# Patient Record
Sex: Female | Born: 1985 | Race: Black or African American | Hispanic: No | Marital: Married | Smoking: Never smoker
Health system: Southern US, Community
[De-identification: ages and names within clinical notes are randomized; demographics above are authoritative.]

## PROBLEM LIST (undated history)

## (undated) DIAGNOSIS — R569 Unspecified convulsions: Secondary | ICD-10-CM

## (undated) HISTORY — PX: HERNIA REPAIR: SHX51

---
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
## 2007-05-30 ENCOUNTER — Ambulatory Visit: Payer: Self-pay | Admitting: Family Medicine

## 2015-01-09 HISTORY — PX: BRAIN SURGERY: SHX531

## 2015-03-26 ENCOUNTER — Encounter: Payer: Self-pay | Admitting: Emergency Medicine

## 2015-03-26 ENCOUNTER — Emergency Department: Payer: Self-pay

## 2015-03-26 ENCOUNTER — Inpatient Hospital Stay
Admission: EM | Admit: 2015-03-26 | Discharge: 2015-03-29 | DRG: 070 | Disposition: A | Discharge status: Home / Self Care | Payer: Self-pay | Attending: Internal Medicine | Admitting: Internal Medicine

## 2015-03-26 DIAGNOSIS — G939 Disorder of brain, unspecified: Principal | ICD-10-CM | POA: Diagnosis present

## 2015-03-26 DIAGNOSIS — D496 Neoplasm of unspecified behavior of brain: Secondary | ICD-10-CM

## 2015-03-26 DIAGNOSIS — R569 Unspecified convulsions: Secondary | ICD-10-CM

## 2015-03-26 DIAGNOSIS — F22 Delusional disorders: Secondary | ICD-10-CM

## 2015-03-26 DIAGNOSIS — R000 Tachycardia, unspecified: Secondary | ICD-10-CM | POA: Diagnosis present

## 2015-03-26 DIAGNOSIS — Z833 Family history of diabetes mellitus: Secondary | ICD-10-CM

## 2015-03-26 DIAGNOSIS — Z8249 Family history of ischemic heart disease and other diseases of the circulatory system: Secondary | ICD-10-CM

## 2015-03-26 DIAGNOSIS — G9389 Other specified disorders of brain: Secondary | ICD-10-CM | POA: Diagnosis present

## 2015-03-26 DIAGNOSIS — G936 Cerebral edema: Secondary | ICD-10-CM | POA: Diagnosis present

## 2015-03-26 DIAGNOSIS — G4089 Other seizures: Secondary | ICD-10-CM | POA: Diagnosis present

## 2015-03-26 DIAGNOSIS — E876 Hypokalemia: Secondary | ICD-10-CM | POA: Diagnosis present

## 2015-03-26 HISTORY — DX: Unspecified convulsions: R56.9

## 2015-03-26 LAB — CBC WITH DIFFERENTIAL/PLATELET
BASOS ABS: 0 10*3/uL (ref 0–0.1)
BASOS PCT: 0 %
EOS ABS: 0 10*3/uL (ref 0–0.7)
Eosinophils Relative: 0 %
HEMATOCRIT: 38.9 % (ref 35.0–47.0)
Hemoglobin: 13.5 g/dL (ref 12.0–16.0)
Lymphocytes Relative: 21 %
Lymphs Abs: 1.9 10*3/uL (ref 1.0–3.6)
MCH: 31.7 pg (ref 26.0–34.0)
MCHC: 34.6 g/dL (ref 32.0–36.0)
MCV: 91.6 fL (ref 80.0–100.0)
MONO ABS: 0.4 10*3/uL (ref 0.2–0.9)
Monocytes Relative: 5 %
NEUTROS ABS: 6.7 10*3/uL — AB (ref 1.4–6.5)
NEUTROS PCT: 74 %
Platelets: 308 10*3/uL (ref 150–440)
RBC: 4.25 MIL/uL (ref 3.80–5.20)
RDW: 12.3 % (ref 11.5–14.5)
WBC: 9.1 10*3/uL (ref 3.6–11.0)

## 2015-03-26 LAB — BASIC METABOLIC PANEL
ANION GAP: 10 (ref 5–15)
BUN: 10 mg/dL (ref 6–20)
CALCIUM: 9.2 mg/dL (ref 8.9–10.3)
CO2: 22 mmol/L (ref 22–32)
CREATININE: 0.8 mg/dL (ref 0.44–1.00)
Chloride: 106 mmol/L (ref 101–111)
Glucose, Bld: 106 mg/dL — ABNORMAL HIGH (ref 65–99)
Potassium: 3.3 mmol/L — ABNORMAL LOW (ref 3.5–5.1)
SODIUM: 138 mmol/L (ref 135–145)

## 2015-03-26 LAB — TROPONIN I

## 2015-03-26 MED ORDER — SODIUM CHLORIDE 0.9 % IV BOLUS (SEPSIS)
1000.0000 mL | Freq: Once | INTRAVENOUS | Status: AC
  Administered 2015-03-26: 1000 mL via INTRAVENOUS

## 2015-03-26 MED ORDER — LEVETIRACETAM 500 MG/5ML IV SOLN
1000.0000 mg | Freq: Once | INTRAVENOUS | Status: AC
  Administered 2015-03-27: 1000 mg via INTRAVENOUS
  Filled 2015-03-26: qty 10

## 2015-03-26 MED ORDER — DEXAMETHASONE SODIUM PHOSPHATE 10 MG/ML IJ SOLN
10.0000 mg | Freq: Once | INTRAMUSCULAR | Status: AC
  Administered 2015-03-26: 10 mg via INTRAVENOUS
  Filled 2015-03-26: qty 1

## 2015-03-26 MED ORDER — ACETAMINOPHEN 325 MG PO TABS
650.0000 mg | ORAL_TABLET | Freq: Once | ORAL | Status: AC
  Administered 2015-03-26: 650 mg via ORAL
  Filled 2015-03-26: qty 2

## 2015-03-26 NOTE — ED Provider Notes (Signed)
[facility] Emergency Department Provider Note  ____________________________________________  Time seen: Approximately 9 PM  I have reviewed the triage vital signs and the nursing notes.   HISTORY  Chief Complaint Seizures    HPI Natasha Luna is a 30 y.o. female without any chronic medical conditions who is presenting to the emergency department tonight after seizure. The seizure was witnessed by a friend who said that the patient turned around and then started falling to the ground. The friend was able to catch the patient before she hit her head hard against the ground. The friend said that the patient then had generalized seizure-like activity for about a minute. Did have some confusion afterwards but returned to her baseline mental status fairly quickly. Did not lose any bowel or bladder continence. Patient has no complaints at this time. Says that about a month ago she was seen at [facility] for another seizure episode. This was her first ever seizure episode. She has no history of head trauma, no family history of seizures. She is not a drinker. She is denying any weakness or pain at this time.   Past Medical History  Diagnosis Date  . Seizures ([place])     There are no active problems to display for this patient.   Past Surgical History  Procedure Laterality Date  . Hernia repair      No current outpatient prescriptions on file.  Allergies Review of patient's allergies indicates no known allergies.  No family history on file.  Social History Social History  Substance Use Topics  . Smoking status: Never Smoker   . Smokeless tobacco: None  . Alcohol Use: None    Review of Systems Constitutional: No fever/chills Eyes: No visual changes. ENT: No sore throat. Cardiovascular: Denies chest pain. Respiratory: Denies shortness of breath. Gastrointestinal: No abdominal pain.  No nausea, no vomiting.  No diarrhea.  No  constipation. Genitourinary: Negative for dysuria. Musculoskeletal: Negative for back pain. Skin: Negative for rash. Neurological: Negative for headaches, focal weakness or numbness.  10-point ROS otherwise negative.  ____________________________________________   PHYSICAL EXAM:  VITAL SIGNS: ED Triage Vitals  Enc Vitals Group     BP 03/26/15 2012 118/77 mmHg     Pulse Rate 03/26/15 2012 96     Resp 03/26/15 2100 25     Temp 03/26/15 2012 98 F (36.7 C)     Temp src --      SpO2 03/26/15 2012 100 %     Weight 03/26/15 2012 190 lb (86.183 kg)     Height 03/26/15 2012 5\' 10"  (1.778 m)     Head Cir --      Peak Flow --      Pain Score 03/26/15 2142 1     Pain Loc --      Pain Edu? --      Excl. in [place]? --     Constitutional: Alert and oriented. Well appearing and in no acute distress. Eyes: Conjunctivae are normal. PERRL. EOMI. Head: Atraumatic. Nose: No congestion/rhinnorhea. Mouth/Throat: Mucous membranes are moist.  Neck: No stridor.   Cardiovascular: Normal rate, regular rhythm. Grossly normal heart sounds.  Good peripheral circulation. Respiratory: Normal respiratory effort.  No retractions. Lungs CTAB. Gastrointestinal: Soft and nontender. No distention. No CVA tenderness. Musculoskeletal: No lower extremity tenderness nor edema.  No joint effusions. Neurologic:  Normal speech and language. No gross focal neurologic deficits are appreciated.  Skin:  Skin is warm, dry and intact. No rash noted. Psychiatric:  Mood and affect are normal. Speech and behavior are normal.  ____________________________________________   LABS (all labs ordered are listed, but only abnormal results are displayed)  Labs Reviewed  CBC WITH DIFFERENTIAL/PLATELET - Abnormal; Notable for the following:    Neutro Abs 6.7 (*)    All other components within normal limits  BASIC METABOLIC PANEL - Abnormal; Notable for the following:    Potassium 3.3 (*)    Glucose, Bld 106 (*)    All other  components within normal limits  TROPONIN I   ____________________________________________  EKG  ED ECG REPORT I, Doran Stabler, the attending physician, personally viewed and interpreted this ECG.   Date: 03/26/2015  EKG Time: 2019  Rate: 106  Rhythm: sinus tachycardia  Axis: Left axis deviation  Intervals:none  ST&T Change: No ST elevation or depression. No abnormal T-wave inversion.  ____________________________________________  RADIOLOGY   IMPRESSION: 1. Left frontal lobe vasogenic edema with associated mass. 2. Findings are suspicious for left frontal lobe mass. Further evaluation with MRI is recommended. 3. Critical Value/emergent results were called by telephone at the time of interpretation on 03/26/2015 at 9:47 p.m. to Dr. Larae Grooms , who verbally acknowledged these results.   Electronically Signed By: Nolon Nations M.D. On: 03/26/2015 21:50 ____________________________________________   PROCEDURES   ____________________________________________   INITIAL IMPRESSION / ASSESSMENT AND PLAN / ED COURSE  Pertinent labs & imaging results that were available during my care of the patient were reviewed by me and considered in my medical decision making (see chart for details).  ----------------------------------------- 11:58 PM on 03/26/2015 -----------------------------------------  I discussed the case with Dr. Grayland Ormond of oncology who recommends 10 mg of Decadron initially and admission to this hospital. I did discuss possible transfer because of the lack of neurosurgery at this hospital however he feels that with Decadron that the swelling will be able to be decreased and that was only 6 mm of shift that there is not a need for neurosurgery in-house. I also discussed the case with Dr. Doy Mince of neurology who recommends a Keppra load of 1 g and then 500 mg every 12 hours.  I discussed the findings with the patient as well as the family  who are at the bedside. She understands that this is likely a mass or cancer in her brain causing the symptoms and the seizures. She understands need for admission to the hospital. The patient was signed out to Dr. Melynda Ripple of the medicine service. ____________________________________________   FINAL CLINICAL IMPRESSION(S) / ED DIAGNOSES  Seizure. Brain mass.    Orbie Pyo, MD 03/26/15 [id number]

## 2015-03-26 NOTE — ED Notes (Signed)
Patient brought in by ems from [facility]. Patient has a witnessed seizure that lasted about 1 minute. Patient with history of one previous seizure about a month ago and has been seen at [place].

## 2015-03-26 NOTE — ED Notes (Signed)
MD at bedside. 

## 2015-03-27 ENCOUNTER — Encounter: Payer: Self-pay | Admitting: Internal Medicine

## 2015-03-27 ENCOUNTER — Inpatient Hospital Stay: Payer: Self-pay

## 2015-03-27 DIAGNOSIS — G9389 Other specified disorders of brain: Secondary | ICD-10-CM | POA: Diagnosis present

## 2015-03-27 DIAGNOSIS — E876 Hypokalemia: Secondary | ICD-10-CM | POA: Diagnosis present

## 2015-03-27 DIAGNOSIS — G936 Cerebral edema: Secondary | ICD-10-CM | POA: Diagnosis present

## 2015-03-27 DIAGNOSIS — R569 Unspecified convulsions: Secondary | ICD-10-CM

## 2015-03-27 DIAGNOSIS — G939 Disorder of brain, unspecified: Principal | ICD-10-CM

## 2015-03-27 DIAGNOSIS — F419 Anxiety disorder, unspecified: Secondary | ICD-10-CM

## 2015-03-27 DIAGNOSIS — Z79899 Other long term (current) drug therapy: Secondary | ICD-10-CM

## 2015-03-27 LAB — CBC
HEMATOCRIT: 37.9 % (ref 35.0–47.0)
HEMOGLOBIN: 13.2 g/dL (ref 12.0–16.0)
MCH: 31.8 pg (ref 26.0–34.0)
MCHC: 34.8 g/dL (ref 32.0–36.0)
MCV: 91.4 fL (ref 80.0–100.0)
Platelets: 291 10*3/uL (ref 150–440)
RBC: 4.15 MIL/uL (ref 3.80–5.20)
RDW: 12.4 % (ref 11.5–14.5)
WBC: 10.1 10*3/uL (ref 3.6–11.0)

## 2015-03-27 LAB — BASIC METABOLIC PANEL
ANION GAP: 4 — AB (ref 5–15)
BUN: 10 mg/dL (ref 6–20)
CALCIUM: 8.9 mg/dL (ref 8.9–10.3)
CHLORIDE: 110 mmol/L (ref 101–111)
CO2: 22 mmol/L (ref 22–32)
Creatinine, Ser: 0.69 mg/dL (ref 0.44–1.00)
GFR calc Af Amer: >60 mL/min (ref >60)
GFR calc non Af Amer: >60 mL/min (ref >60)
GLUCOSE: 154 mg/dL — AB (ref 65–99)
POTASSIUM: 3.9 mmol/L (ref 3.5–5.1)
Sodium: 136 mmol/L (ref 135–145)

## 2015-03-27 LAB — MAGNESIUM: Magnesium: 1.8 mg/dL (ref 1.7–2.4)

## 2015-03-27 MED ORDER — ENOXAPARIN SODIUM 40 MG/0.4ML ~~LOC~~ SOLN
40.0000 mg | SUBCUTANEOUS | Status: DC
  Administered 2015-03-27 – 2015-03-29 (×3): 40 mg via SUBCUTANEOUS
  Filled 2015-03-27 (×3): qty 0.4

## 2015-03-27 MED ORDER — LEVETIRACETAM 500 MG PO TABS
500.0000 mg | ORAL_TABLET | Freq: Two times a day (BID) | ORAL | Status: DC
  Administered 2015-03-27 – 2015-03-29 (×6): 500 mg via ORAL
  Filled 2015-03-27 (×6): qty 1

## 2015-03-27 MED ORDER — MORPHINE SULFATE (PF) 2 MG/ML IV SOLN
INTRAVENOUS | Status: AC
  Administered 2015-03-26: 02:00:00 2 mg via INTRAVENOUS
  Filled 2015-03-27: qty 1

## 2015-03-27 MED ORDER — DIPHENHYDRAMINE HCL 25 MG PO CAPS
50.0000 mg | ORAL_CAPSULE | Freq: Three times a day (TID) | ORAL | Status: DC | PRN
  Administered 2015-03-27 – 2015-03-29 (×2): 50 mg via ORAL
  Filled 2015-03-27 (×3): qty 2

## 2015-03-27 MED ORDER — IOHEXOL 300 MG/ML  SOLN
100.0000 mL | Freq: Once | INTRAMUSCULAR | Status: AC | PRN
  Administered 2015-03-27: 100 mL via INTRAVENOUS

## 2015-03-27 MED ORDER — LORAZEPAM 2 MG/ML IJ SOLN
2.0000 mg | INTRAMUSCULAR | Status: DC | PRN
  Administered 2015-03-27: 2 mg via INTRAVENOUS
  Filled 2015-03-27: qty 1

## 2015-03-27 MED ORDER — IOHEXOL 240 MG/ML SOLN
25.0000 mL | INTRAMUSCULAR | Status: AC
  Administered 2015-03-27 (×2): 25 mL via ORAL

## 2015-03-27 MED ORDER — MORPHINE SULFATE (PF) 2 MG/ML IV SOLN
2.0000 mg | INTRAVENOUS | Status: DC | PRN
  Administered 2015-03-26 – 2015-03-27 (×2): 2 mg via INTRAVENOUS

## 2015-03-27 MED ORDER — POTASSIUM CHLORIDE 20 MEQ/15ML (10%) PO SOLN
40.0000 meq | Freq: Once | ORAL | Status: AC
  Administered 2015-03-27: 40 meq via ORAL
  Filled 2015-03-27: qty 30

## 2015-03-27 MED ORDER — DEXAMETHASONE SODIUM PHOSPHATE 10 MG/ML IJ SOLN
10.0000 mg | Freq: Four times a day (QID) | INTRAMUSCULAR | Status: DC
Start: 2015-03-27 — End: 2015-03-29
  Administered 2015-03-27 – 2015-03-29 (×10): 10 mg via INTRAVENOUS
  Filled 2015-03-27 (×10): qty 1

## 2015-03-27 NOTE — H&P (Signed)
[facility] at [place] NAME: Natasha Luna    MR#:  [id number]  DATE OF BIRTH:  08-25-1985  DATE OF ADMISSION:  03/26/2015  PRIMARY CARE PHYSICIAN: [place]   REQUESTING/REFERRING PHYSICIAN: Dr Dineen Kid  CHIEF COMPLAINT:   Chief Complaint  Patient presents with  . Seizures    HISTORY OF PRESENT ILLNESS: Natasha Luna  is a 30 y.o. female with no significant medical history except for a seizure that occurred about one month ago. She presented to the ED today after she had another episode of seizures. Patient was said to [place] when she suddenly spun to the left and started falling. While she was falling she was reported to have arm and leg contraction and her fall incomplete as her friend stop her from completely falling to the ground. She continues to have spasms for about 1 minute and she had some jerking movements all over her body with twisting of late and have this was said to have become ashen at that time. There is no frothy saliva or biting of tongue observed and the school episode of left-sided about 1 minute. After the seizure episode, patient was confused for a few minutes but there was no reported paralysis or incontinence. She was reported to have some fast heart beat and fast breathing after this episode. Patient denies any alcohol use or recreational drug use. She had one seizure about a month ago while bathing her baby but she was not started on treatment because he was a first episode.  On arrival in the ED today, vital signs are stable except for an initial respiratory rate of 25 but this has resolved at this time. CBC was unremarkable and CMP was significant for potassium of 3.3 only. Troponin was negative. CT of the head showed left frontal lobe mass with vasogenic edema as well as some 6 mm left-to-right shift. There is no hemorrhage in the brain. EKG shows sinus  tachycardia at 106 with left atrial enlargement and left axis deviation and decreased voltage. 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  She wishes to be full code.  PAST MEDICAL HISTORY:   Past Medical History  Diagnosis Date  . Seizures ([place])     PAST SURGICAL HISTORY: Past Surgical History  Procedure Laterality Date  . Hernia repair      SOCIAL HISTORY:  Social History  Substance Use Topics  . Smoking status: Never Smoker   . Smokeless tobacco: Not on file  . Alcohol Use: Not on file    FAMILY HISTORY:  Family History  Problem Relation Age of Onset  . Diabetes Mother     DRUG ALLERGIES: No Known Allergies  REVIEW OF SYSTEMS:   CONSTITUTIONAL: No fever. EYES: No blurred or double vision.  EARS, NOSE, AND THROAT: No tinnitus or ear pain.  RESPIRATORY: No cough, shortness of breath, wheezing or hemoptysis.  CARDIOVASCULAR: No chest pain, orthopnea, edema.  GASTROINTESTINAL: No nausea, vomiting, diarrhea or abdominal pain.  GENITOURINARY: No dysuria, hematuria.  ENDOCRINE: No polyuria, nocturia,  HEMATOLOGY: No anemia, easy bruising or bleeding SKIN: No rash or lesion. MUSCULOSKELETAL: No joint pain or arthritis.   NEUROLOGIC: No tingling, numbness, weakness.  PSYCHIATRY: No anxiety or depression.  MEDICATIONS AT HOME:  Prior to Admission medications   Not on File      PHYSICAL EXAMINATION:   VITAL SIGNS: Blood pressure 113/65, pulse 92, temperature 98 F (36.7 C), resp. rate 17, height 5\' 10"  (1.778 m), weight 86.183 kg (190 lb), last menstrual period 03/12/2015, SpO2 100 %.  GENERAL:  31 y.o.-year-old patient lying in the bed with no acute distress. Alert and oriented x 3. EYES: Pupils equal, round, reactive to light and accommodation. No scleral icterus. Extraocular muscles intact.   HEENT: Head atraumatic, normocephalic. Oropharynx and nasopharynx clear.  NECK:  Supple, no jugular venous distention. No thyroid enlargement, no tenderness.  LUNGS: Normal breath sounds bilaterally, no wheezing, rales,rhonchi or crepitation. No use of accessory muscles of respiration.  CARDIOVASCULAR: S1, S2 normal. No murmurs, rubs, or gallops.  ABDOMEN: Soft, nontender, nondistended. Bowel sounds present. No organomegaly or mass.  EXTREMITIES: No pedal edema, cyanosis, or clubbing.  NEUROLOGIC: Cranial nerves II through XII are intact. Muscle strength 5/5 in all extremities. Sensation intact. Gait not checked.   SKIN: No obvious rash, lesion, or ulcer.   LABORATORY PANEL:   CBC  Recent Labs Lab 03/26/15 2020  WBC 9.1  HGB 13.5  HCT 38.9  PLT 308  MCV 91.6  MCH 31.7  MCHC 34.6  RDW 12.3  LYMPHSABS 1.9  MONOABS 0.4  EOSABS 0.0  BASOSABS 0.0   ------------------------------------------------------------------------------------------------------------------  Chemistries   Recent Labs Lab 03/26/15 2020  NA 138  K 3.3*  CL 106  CO2 22  GLUCOSE 106*  BUN 10  CREATININE 0.80  CALCIUM 9.2   ------------------------------------------------------------------------------------------------------------------ estimated creatinine clearance is 123.8 mL/min (by C-G formula based on Cr of 0.8). ------------------------------------------------------------------------------------------------------------------ No results for input(s): TSH, T4TOTAL, T3FREE, THYROIDAB in the last 72 hours.  Invalid input(s): FREET3   Coagulation profile No results for input(s): INR, PROTIME in the last 168 hours. ------------------------------------------------------------------------------------------------------------------- No results for input(s): DDIMER in the last 72  hours. -------------------------------------------------------------------------------------------------------------------  Cardiac Enzymes  Recent Labs Lab 03/26/15 2020  TROPONINI <0.03   ------------------------------------------------------------------------------------------------------------------ Invalid input(s): POCBNP  ---------------------------------------------------------------------------------------------------------------  Urinalysis No results found for: COLORURINE, APPEARANCEUR, LABSPEC, PHURINE, GLUCOSEU, HGBUR, BILIRUBINUR, KETONESUR, PROTEINUR, UROBILINOGEN, NITRITE, LEUKOCYTESUR   RADIOLOGY: Ct Head Wo Contrast  03/26/2015  CLINICAL DATA:  Witnessed seizure today at [employer], one previous seizure last month, pt seen at [facility], but states no imaging was done EXAM: CT HEAD WITHOUT CONTRAST TECHNIQUE: Contiguous axial images were obtained from the base of the skull through the vertex without intravenous contrast. COMPARISON:  None. FINDINGS: There is significant vasogenic edema in the left frontal lobe, suspicious for focal mass. There is focal left-to-right midline shift measuring 6 mm. No associated hemorrhage. There is mild mass effect on the left frontal warm. Otherwise, the basilar cisterns and ventricles are normal in appearance. Bone windows are unremarkable. IMPRESSION: 1. Left frontal lobe vasogenic edema with associated mass. 2. Findings are suspicious for left frontal lobe mass. Further evaluation with MRI is recommended. 3. Critical Value/emergent results were called by telephone at the time of interpretation on 03/26/2015 at 9:47 p.m. to Dr. Larae Grooms , who verbally acknowledged these results. Electronically Signed   By: Nolon Nations M.D.   On: 03/26/2015 21:50    EKG: Orders placed or performed during the hospital encounter of 03/26/15  . EKG 12-Lead  . 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 Left frontal lobe mass causing cerebral edema with mass effect adjacent area - Patient presents to the ED with a seizure but was found to have left frontal lobe mass causing a mass effect on CT scan. - MRI will be obtained to better characterize the lesion. - Oncology was consulted in the ED and recommended IV Decadron due to cerebral edema - Continue neuro checks and place official consult to oncology. - Check TSH  2). 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  - Patient had one seizure about a month ago and was not started on any treatment.  - Neurology was consulted in the ED and recommend loading patient report, Keppra and continued 500 mg twice a day. - Continue seizure precautions. - When necessary IV lorazepam for acute seizures  3). Hypokalemia - Patient presented with a potassium of 3.3. We'll check magnesium and replete potassium - Monitor potassium levels.  All the records are reviewed and case discussed with ED provider. Management plans discussed with the patient, family and they are in agreement.  CODE STATUS: Code Status History    This patient does not have a recorded code status. Please follow your organizational policy for patients in this situation.      I have independently reviewed all EKG and chest x-ray data  VTE prophylaxis: Lovenox if no contraindications and patient at low risk for bleeding. SCD's and progressive ambulation if patient has contraindications to anticoagulants. No DVT prophylaxis if patient presently receiving therapeutic anticoagulation or is at significant risk of bleeding for which the risk of anticoagulation outweigh the potential benefits.  Vaccinations: Pneumonia & flu vaccine per hospital  protocol Prevention: Will proceed with conservative measures for the prevention of delirium in patients older than 65. Fall precautions and 1:1 sitter as needed per hospital protocol.  TOTAL TIME TAKING CARE OF THIS PATIENT: 50 minutes.    Sylvan Cheese M.D on 03/27/2015 at 12:28 AM  Between 7am to 6pm - Pager - [phone]  After 6pm go to [email or web address] - password [email or web address] [facility]  [facility]  Office  [phone]  CC: Primary care physician; [place]

## 2015-03-27 NOTE — Consult Note (Signed)
[place]  Telephone:(336) [phone] Fax:(336) [phone]  ID: Natasha Luna OB: [birth year]  MR#: [id number]  [id number]  Patient Care Team: [place] as PCP - General (General Practice)  CHIEF COMPLAINT:  Chief Complaint  Patient presents with  . Seizures    INTERVAL HISTORY: Patient is a 30 year old female who had a witnessed tonic-clonic seizure yesterday. Patient was brought to the emergency room and CT scan of brain revealed left frontal lobe mass with significant vasogenic edema. Patient had a similar episode approximately one month ago and was evaluated at [facility], but no brain imaging was done at that time. Patient is anxious, but otherwise feels well. She has had no further seizures. She has no other neurologic complaints. She denies any recent fevers or illnesses. She has a good appetite and denies weight loss. She has no chest pain, shortness of breath, cough, or hemoptysis. She has no nausea, vomiting, constipation, or diarrhea. She has no urinary complaints. Patient otherwise feels well and offers no further specific complaints.  REVIEW OF SYSTEMS:   Review of Systems  Constitutional: Negative for fever, weight loss and malaise/fatigue.  HENT: Negative for hearing loss.   Eyes: Negative for blurred vision, double vision and photophobia.  Respiratory: Negative.  Negative for cough, hemoptysis, sputum production and shortness of breath.   Cardiovascular: Negative.  Negative for chest pain.  Gastrointestinal: Negative.  Negative for blood in stool and melena.  Genitourinary: Negative.   Musculoskeletal: Negative.   Neurological: Positive for seizures and loss of consciousness. Negative for sensory change, focal weakness, weakness and headaches.  Psychiatric/Behavioral: The patient is nervous/anxious.     As per HPI. Otherwise, a complete review of systems is negatve.  PAST MEDICAL HISTORY: Past Medical History  Diagnosis Date    . Seizures ([place])     PAST SURGICAL HISTORY: Past Surgical History  Procedure Laterality Date  . Hernia repair      FAMILY HISTORY Family History  Problem Relation Age of Onset  . Diabetes Mother        ADVANCED DIRECTIVES:    HEALTH MAINTENANCE: Social History  Substance Use Topics  . Smoking status: Never Smoker   . Smokeless tobacco: None  . Alcohol Use: None     Colonoscopy:  PAP:  Bone density:  Lipid panel:  No Known Allergies  Current Facility-Administered Medications  Medication Dose Route Frequency Provider Last Rate Last Dose  . dexamethasone (DECADRON) injection 10 mg  10 mg Intravenous 4 times per day Sylvan Cheese, MD   10 mg at 03/27/15 0624  . enoxaparin (LOVENOX) injection 40 mg  40 mg Subcutaneous Q24H Sylvan Cheese, MD   40 mg at 03/27/15 0618  . levETIRAcetam (KEPPRA) tablet 500 mg  500 mg Oral BID Sylvan Cheese, MD   500 mg at 03/27/15 0617  . LORazepam (ATIVAN) injection 2 mg  2 mg Intravenous Q15 min PRN Sylvan Cheese, MD      . morphine 2 MG/ML injection 2 mg  2 mg Intravenous Q4H PRN Sylvan Cheese, MD   2 mg at 03/27/15 0122    OBJECTIVE: Filed Vitals:   03/27/15 0230 03/27/15 0313  BP: 105/64 116/63  Pulse:  79  Temp:  97.9 F (36.6 C)  Resp: 21 16     Body mass index is 27.26 kg/(m^2).    ECOG FS:0 - Asymptomatic  General: Well-developed, well-nourished, no acute distress. Eyes: Pink conjunctiva, anicteric sclera. HEENT: Normocephalic, moist mucous membranes, clear oropharnyx. Lungs: Clear to  auscultation bilaterally. Heart: Regular rate and rhythm. No rubs, murmurs, or gallops. Abdomen: Soft, nontender, nondistended. No organomegaly noted, normoactive bowel sounds. Musculoskeletal: No edema, cyanosis, or clubbing. Neuro: Alert, answering all questions appropriately. Cranial nerves grossly intact. Skin: No rashes or petechiae noted. Psych: Normal affect. Lymphatics: No cervical, calvicular, axillary or inguinal  LAD.   LAB RESULTS:  Lab Results  Component Value Date   NA 136 03/27/2015   K 3.9 03/27/2015   CL 110 03/27/2015   CO2 22 03/27/2015   GLUCOSE 154* 03/27/2015   BUN 10 03/27/2015   CREATININE 0.69 03/27/2015   CALCIUM 8.9 03/27/2015   GFRNONAA >60 03/27/2015   GFRAA >60 03/27/2015    Lab Results  Component Value Date   WBC 10.1 03/27/2015   NEUTROABS 6.7* 03/26/2015   HGB 13.2 03/27/2015   HCT 37.9 03/27/2015   MCV 91.4 03/27/2015   PLT 291 03/27/2015     STUDIES: Ct Head Wo Contrast  03/26/2015  CLINICAL DATA:  Witnessed seizure today at [employer], one previous seizure last month, pt seen at [facility], but states no imaging was done EXAM: CT HEAD WITHOUT CONTRAST TECHNIQUE: Contiguous axial images were obtained from the base of the skull through the vertex without intravenous contrast. COMPARISON:  None. FINDINGS: There is significant vasogenic edema in the left frontal lobe, suspicious for focal mass. There is focal left-to-right midline shift measuring 6 mm. No associated hemorrhage. There is mild mass effect on the left frontal warm. Otherwise, the basilar cisterns and ventricles are normal in appearance. Bone windows are unremarkable. IMPRESSION: 1. Left frontal lobe vasogenic edema with associated mass. 2. Findings are suspicious for left frontal lobe mass. Further evaluation with MRI is recommended. 3. Critical Value/emergent results were called by telephone at the time of interpretation on 03/26/2015 at 9:47 p.m. to Dr. Larae Grooms , who verbally acknowledged these results. Electronically Signed   By: Nolon Nations M.D.   On: 03/26/2015 21:50    ASSESSMENT: Left frontal lobe mass suspicious for malignancy.  PLAN:    1. Left frontal lobe mass: Highly suspicious for malignancy, unclear if it primary brain tumor or metastatic lesion. Continue 10 mg Decadron IV every 6 hours to help reduce vasogenic edema. Will order CT scan of the chest, abdomen, and pelvis  for further evaluation and assess if there is a primary lesion. Radiation oncology has also been consulted for further evaluation. 2. Seizures: Secondary to left frontal lobe mass. Appreciate neurology input. Decadron as above to reduce vasogenic edema. Continue Keppra as ordered. 3. Disposition: Patient can likely be discharged in several days on oral Decadron and Keppra.  Appreciate consult, will follow.   Lloyd Huger, MD   03/27/2015 7:24 AM

## 2015-03-27 NOTE — Consult Note (Signed)
Reason for Consult:Seizure Referring Physician: Gouru  CC: Seizure  HPI: Natasha Luna is an 30 y.o. female  with no significant medical history except for a seizure that occurred about one month ago. She presented to the ED yesterday after she had another episode of seizure. Patient reports that she was [place] around [place] with her [place] when she suddenly felt her head pull to the right and she started falling. Per her friend while she was falling she was reported to have arm and leg contraction.  She did not incur injury with her fall because her friend stopped her from completely falling to the ground. She continued to have spasms for about 1 minute and she had some generalized jerking movements.  After the seizure episode, patient was confused for a few minutes.  There was no associated tongue biting, bowel/bladder incontinence.    Past Medical History  Diagnosis Date  . Seizures [facility])     Past Surgical History  Procedure Laterality Date  . Hernia repair      Family History  Problem Relation Age of Onset  . Diabetes Mother     Mother with hypertension  Social History:  reports that she has never smoked. She does not have any smokeless tobacco history on file. Her alcohol and drug histories are not on file.  No Known Allergies  Medications:  I have reviewed the patient's current medications. Prior to Admission:  No prescriptions prior to admission   Scheduled: . dexamethasone  10 mg Intravenous 4 times per day  . enoxaparin (LOVENOX) injection  40 mg Subcutaneous Q24H  . levETIRAcetam  500 mg Oral BID    ROS: History obtained from the patient  General ROS: negative for - chills, fatigue, fever, night sweats, weight gain or weight loss Psychological ROS: negative for - behavioral disorder, hallucinations, memory difficulties, mood swings or suicidal ideation Ophthalmic ROS: negative for - blurry vision, double vision, eye pain or loss of vision ENT ROS:  negative for - epistaxis, nasal discharge, oral lesions, sore throat, tinnitus or vertigo Allergy and Immunology ROS: negative for - hives or itchy/watery eyes Hematological and Lymphatic ROS: negative for - bleeding problems, bruising or swollen lymph nodes Endocrine ROS: negative for - galactorrhea, hair pattern changes, polydipsia/polyuria or temperature intolerance Respiratory ROS: negative for - cough, hemoptysis, shortness of breath or wheezing Cardiovascular ROS: negative for - chest pain, dyspnea on exertion, edema or irregular heartbeat Gastrointestinal ROS: negative for - abdominal pain, diarrhea, hematemesis, nausea/vomiting or stool incontinence Genito-Urinary ROS: negative for - dysuria, hematuria, incontinence or urinary frequency/urgency Musculoskeletal ROS: left arm pain Neurological ROS: as noted in HPI Dermatological ROS: negative for rash and skin lesion changes  Physical Examination: Blood pressure 116/63, pulse 79, temperature 97.9 F (36.6 C), temperature source Oral, resp. rate 16, height 5\' 10"  (1.778 m), weight 86.183 kg (190 lb), last menstrual period 03/12/2015, SpO2 96 %.  HEENT-  Normocephalic, no lesions, without obvious abnormality.  Normal external eye and conjunctiva.  Normal TM's bilaterally.  Normal auditory canals and external ears. Normal external nose, mucus membranes and septum.  Normal pharynx. Cardiovascular- S1, S2 normal, pulses palpable throughout   Lungs- chest clear, no wheezing, rales, normal symmetric air entry Abdomen- soft, non-tender; bowel sounds normal; no masses,  no organomegaly Extremities- no edema Lymph-no adenopathy palpable Musculoskeletal-no joint tenderness, deformity or swelling Skin-warm and dry, no hyperpigmentation, vitiligo, or suspicious lesions  Neurological Examination Mental Status: Alert, oriented, thought content appropriate.  Speech fluent without  evidence of aphasia.  Able to follow 3 step commands without  difficulty. Cranial Nerves: II: Discs flat bilaterally; Visual fields grossly normal, pupils equal, round, reactive to light and accommodation III,IV, VI: ptosis not present, extra-ocular motions intact bilaterally V,VII: smile symmetric, facial light touch sensation normal bilaterally VIII: hearing normal bilaterally IX,X: gag reflex present XI: bilateral shoulder shrug XII: midline tongue extension Motor: Right : Upper extremity   5/5    Left:     Upper extremity   5/5  Lower extremity   5/5     Lower extremity   5/5 Tone and bulk:normal tone throughout; no atrophy noted Sensory: Pinprick and light touch intact throughout, bilaterally Deep Tendon Reflexes: 2+ in the upper extremities and absent in the lower extremities.   Plantars: Right: downgoing   Left: downgoing Cerebellar: normal finger-to-nose, normal rapid alternating movements and normal heel-to-shin test Gait: not tested due to safety concerns      Laboratory Studies:   Basic Metabolic Panel:  Recent Labs Lab 03/26/15 2020 03/27/15 0441  NA 138 136  K 3.3* 3.9  CL 106 110  CO2 22 22  GLUCOSE 106* 154*  BUN 10 10  CREATININE 0.80 0.69  CALCIUM 9.2 8.9  MG 1.8  --     Liver Function Tests: No results for input(s): AST, ALT, ALKPHOS, BILITOT, PROT, ALBUMIN in the last 168 hours. No results for input(s): LIPASE, AMYLASE in the last 168 hours. No results for input(s): AMMONIA in the last 168 hours.  CBC:  Recent Labs Lab 03/26/15 2020 03/27/15 0441  WBC 9.1 10.1  NEUTROABS 6.7*  --   HGB 13.5 13.2  HCT 38.9 37.9  MCV 91.6 91.4  PLT 308 291    Cardiac Enzymes:  Recent Labs Lab 03/26/15 2020  TROPONINI <0.03    BNP: Invalid input(s): POCBNP  CBG: No results for input(s): GLUCAP in the last 168 hours.  Microbiology: No results found for this or any previous visit.  Coagulation Studies: No results for input(s): LABPROT, INR in the last 72 hours.  Urinalysis: No results for input(s):  COLORURINE, LABSPEC, PHURINE, GLUCOSEU, HGBUR, BILIRUBINUR, KETONESUR, PROTEINUR, UROBILINOGEN, NITRITE, LEUKOCYTESUR in the last 168 hours.  Invalid input(s): APPERANCEUR  Lipid Panel:  No results found for: CHOL, TRIG, HDL, CHOLHDL, VLDL, LDLCALC  HgbA1C: No results found for: HGBA1C  Urine Drug Screen:  No results found for: LABOPIA, COCAINSCRNUR, LABBENZ, AMPHETMU, THCU, LABBARB  Alcohol Level: No results for input(s): ETH in the last 168 hours.  Other results: EKG: sinus tachycardia at 106 bpm.  Imaging: Ct Head Wo Contrast  03/26/2015  CLINICAL DATA:  Witnessed seizure today at [employer], one previous seizure last month, pt seen at [facility], but states no imaging was done EXAM: CT HEAD WITHOUT CONTRAST TECHNIQUE: Contiguous axial images were obtained from the base of the skull through the vertex without intravenous contrast. COMPARISON:  None. FINDINGS: There is significant vasogenic edema in the left frontal lobe, suspicious for focal mass. There is focal left-to-right midline shift measuring 6 mm. No associated hemorrhage. There is mild mass effect on the left frontal warm. Otherwise, the basilar cisterns and ventricles are normal in appearance. Bone windows are unremarkable. IMPRESSION: 1. Left frontal lobe vasogenic edema with associated mass. 2. Findings are suspicious for left frontal lobe mass. Further evaluation with MRI is recommended. 3. Critical Value/emergent results were called by telephone at the time of interpretation on 03/26/2015 at 9:47 p.m. to Dr. Larae Grooms , who verbally acknowledged these  results. Electronically Signed   By: Nolon Nations M.D.   On: 03/26/2015 21:50     Assessment/Plan: 30 year old female presenting with recurrent seizure.  Neurological examination is nonfocal.  Head CT personally reviewed and shows left frontal lobe mas with vasogenic edema and midline shift.  Etiology unclear-metastasis versus primary.  Patient loaded with Keppra in  the ED.  Tolerating load well.  Oncology following patient and managing edema and work up for primary.  Recommendations: 1.  Seizure precautions 2.  Continue maintenance Keppra at 500mg  BID 3.  MRI of the brain with and without contrast 4.  Patient unable to drive, operate heavy machinery, perform activities at heights and participate in water activities until release by outpatient physician.  Alexis Goodell, MD Neurology [phone] 03/27/2015, 10:27 AM

## 2015-03-27 NOTE — Progress Notes (Signed)
[facility] [place] NAME: Natasha Luna    MR#:  [id number]  DATE OF BIRTH:  08/26/1985  SUBJECTIVE:  CHIEF COMPLAINT:  Patient is resting comfortably. No other episodes of seizure were noticed. Slept well overnight  REVIEW OF SYSTEMS:  CONSTITUTIONAL: No fever, fatigue or weakness.  EYES: No blurred or double vision.  EARS, NOSE, AND THROAT: No tinnitus or ear pain.  RESPIRATORY: No cough, shortness of breath, wheezing or hemoptysis.  CARDIOVASCULAR: No chest pain, orthopnea, edema.  GASTROINTESTINAL: No nausea, vomiting, diarrhea or abdominal pain.  GENITOURINARY: No dysuria, hematuria.  ENDOCRINE: No polyuria, nocturia,  HEMATOLOGY: No anemia, easy bruising or bleeding SKIN: No rash or lesion. MUSCULOSKELETAL: No joint pain or arthritis.   NEUROLOGIC: No tingling, numbness, weakness.  PSYCHIATRY: No anxiety or depression.   DRUG ALLERGIES:  No Known Allergies  VITALS:  Blood pressure 115/54, pulse 66, temperature 98.6 F (37 C), temperature source Oral, resp. rate 18, height 5\' 10"  (1.778 m), weight 86.183 kg (190 lb), last menstrual period 03/12/2015, SpO2 96 %.  PHYSICAL EXAMINATION:  GENERAL:  30 y.o.-year-old patient lying in the bed with no acute distress.  EYES: Pupils equal, round, reactive to light and accommodation. No scleral icterus. Extraocular muscles intact.  HEENT: Head atraumatic, normocephalic. Oropharynx and nasopharynx clear.  NECK:  Supple, no jugular venous distention. No thyroid enlargement, no tenderness.  LUNGS: Normal breath sounds bilaterally, no wheezing, rales,rhonchi or crepitation. No use of accessory muscles of respiration.  CARDIOVASCULAR: S1, S2 normal. No murmurs, rubs, or gallops.  ABDOMEN: Soft, nontender, nondistended. Bowel sounds present. No organomegaly or mass.  EXTREMITIES: No pedal edema, cyanosis, or clubbing.  NEUROLOGIC: Cranial nerves II through XII are intact. Muscle strength  5/5 in all extremities. Sensation intact. Gait not checked.  PSYCHIATRIC: The patient is alert and oriented x 3.  SKIN: No obvious rash, lesion, or ulcer.    LABORATORY PANEL:   CBC  Recent Labs Lab 03/27/15 0441  WBC 10.1  HGB 13.2  HCT 37.9  PLT 291   ------------------------------------------------------------------------------------------------------------------  Chemistries   Recent Labs Lab 03/26/15 2020 03/27/15 0441  NA 138 136  K 3.3* 3.9  CL 106 110  CO2 22 22  GLUCOSE 106* 154*  BUN 10 10  CREATININE 0.80 0.69  CALCIUM 9.2 8.9  MG 1.8  --    ------------------------------------------------------------------------------------------------------------------  Cardiac Enzymes  Recent Labs Lab 03/26/15 2020  TROPONINI <0.03   ------------------------------------------------------------------------------------------------------------------  RADIOLOGY:  Ct Head Wo Contrast  03/26/2015  CLINICAL DATA:  Witnessed seizure today at [employer], one previous seizure last month, pt seen at [facility], but states no imaging was done EXAM: CT HEAD WITHOUT CONTRAST TECHNIQUE: Contiguous axial images were obtained from the base of the skull through the vertex without intravenous contrast. COMPARISON:  None. FINDINGS: There is significant vasogenic edema in the left frontal lobe, suspicious for focal mass. There is focal left-to-right midline shift measuring 6 mm. No associated hemorrhage. There is mild mass effect on the left frontal warm. Otherwise, the basilar cisterns and ventricles are normal in appearance. Bone windows are unremarkable. IMPRESSION: 1. Left frontal lobe vasogenic edema with associated mass. 2. Findings are suspicious for left frontal lobe mass. Further evaluation with MRI is recommended. 3. Critical Value/emergent results were called by telephone at the time of interpretation on 03/26/2015 at 9:47 p.m. to Dr. Larae Grooms , who verbally acknowledged  these results. Electronically Signed   By: Nolon Nations M.D.  On: 03/26/2015 21:50   Ct Chest W Contrast  03/27/2015  CLINICAL DATA:  Probable left frontal lobe mass with associated vasogenic edema on a noncontrast head CT obtained yesterday. Clinical concern that this could represent a metastasis. EXAM: CT CHEST, ABDOMEN, AND PELVIS WITH CONTRAST TECHNIQUE: Multidetector CT imaging of the chest, abdomen and pelvis was performed following the standard protocol during bolus administration of intravenous contrast. CONTRAST:  147mL OMNIPAQUE IOHEXOL 300 MG/ML  SOLN COMPARISON:  Head CT obtained yesterday. FINDINGS: CT CHEST FINDINGS Mediastinum/Lymph Nodes: No masses, pathologically enlarged lymph nodes, or other significant abnormality. Lungs/Pleura: No pulmonary mass, infiltrate, or effusion. Musculoskeletal: No chest wall mass or suspicious bone lesions identified. CT ABDOMEN PELVIS FINDINGS Hepatobiliary: No masses or other significant abnormality. Pancreas: No mass, inflammatory changes, or other significant abnormality. Spleen: Within normal limits in size and appearance. Adrenals/Urinary Tract: No masses identified. No evidence of hydronephrosis. Stomach/Bowel: No evidence of obstruction, inflammatory process, or abnormal fluid collections. Vascular/Lymphatic: No pathologically enlarged lymph nodes. No evidence of abdominal aortic aneurysm. Reproductive: No mass or other significant abnormality. Intrauterine device in the uterus. Other: Trace amount of free peritoneal fluid in the inferior pelvis, within normal limits of physiological fluid. 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 No evidence of bone metastases. IMPRESSION: No evidence of malignancy in the chest, abdomen or pelvis. Electronically Signed   By: Claudie Revering M.D.   On: 03/27/2015 12:51   Ct Abdomen Pelvis W Contrast  03/27/2015  CLINICAL DATA:  Probable  left frontal lobe mass with associated vasogenic edema on a noncontrast head CT obtained yesterday. Clinical concern that this could represent a metastasis. EXAM: CT CHEST, ABDOMEN, AND PELVIS WITH CONTRAST TECHNIQUE: Multidetector CT imaging of the chest, abdomen and pelvis was performed following the standard protocol during bolus administration of intravenous contrast. CONTRAST:  174mL OMNIPAQUE IOHEXOL 300 MG/ML  SOLN COMPARISON:  Head CT obtained yesterday. FINDINGS: CT CHEST FINDINGS Mediastinum/Lymph Nodes: No masses, pathologically enlarged lymph nodes, or other significant abnormality. Lungs/Pleura: No pulmonary mass, infiltrate, or effusion. Musculoskeletal: No chest wall mass or suspicious bone lesions identified. CT ABDOMEN PELVIS FINDINGS Hepatobiliary: No masses or other significant abnormality. Pancreas: No mass, inflammatory changes, or other significant abnormality. Spleen: Within normal limits in size and appearance. Adrenals/Urinary Tract: No masses identified. No evidence of hydronephrosis. Stomach/Bowel: No evidence of obstruction, inflammatory process, or abnormal fluid collections. Vascular/Lymphatic: No pathologically enlarged lymph nodes. No evidence of abdominal aortic aneurysm. Reproductive: No mass or other significant abnormality. Intrauterine device in the uterus. Other: Trace amount of free peritoneal fluid in the inferior pelvis, within normal limits of physiological fluid. 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 No evidence of bone metastases. IMPRESSION: No evidence of malignancy in the chest, abdomen or pelvis. Electronically Signed   By: Claudie Revering M.D.   On: 03/27/2015 12:51    EKG:   Orders placed or performed during the hospital encounter of 03/26/15  . EKG 12-Lead  . EKG 12-Lead    ASSESSMENT AND PLAN:   1). 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 - MRI Brain pending. -Continue IV Decadron for cerebral edema -CT chest abdomen and pelvis with no masses -Appreciate oncology and neurology recommendations  -Oncology has  discussed with radiation oncology - Continue neuro checks  - Check TSH  2). 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  - Patient had one seizure about a month ago and was not started on any treatment.  - Neurology is recommending to continue Keppra  500 mg twice a day. - Continue seizure precautions. -Instructed not to drive ,  not to operate heavy machinery and not to perform activities at [facility] and water activities - When necessary IV lorazepam for acute seizures  3). Hypokalemia - Patient presented with a potassium of 3.3. -Repleted. Repeat potassium 3.9 and magnesium 1.8  All the records are reviewed and case discussed with ED provider. Management plans discussed with the patient, family and they are in agreement.     All the records are reviewed and case discussed with Care Management/Social Workerr. Management plans discussed with the patient, family and they are in agreement.  CODE STATUS: FC   TOTAL TIME TAKING CARE OF THIS PATIENT: 35 minutes.   POSSIBLE D/C IN 1-2  DAYS, DEPENDING ON CLINICAL CONDITION.   Nicholes Mango M.D on 03/27/2015 at 1:49 PM  Between 7am to 6pm - Pager - [phone] After 6pm go to [email or web address] - password [email or web address]  [facility] [place] [facility]  Office  [phone]  CC: Primary care physician; [place]

## 2015-03-27 NOTE — ED Notes (Signed)
Called floor to let them know pt on the way up 

## 2015-03-28 ENCOUNTER — Institutional Professional Consult (permissible substitution): Payer: Self-pay | Admitting: Radiation Oncology

## 2015-03-28 ENCOUNTER — Inpatient Hospital Stay: Admit: 2015-03-28 | Payer: Self-pay | Admitting: Radiation Oncology

## 2015-03-28 ENCOUNTER — Inpatient Hospital Stay: Payer: MEDICAID

## 2015-03-28 LAB — BASIC METABOLIC PANEL
Anion gap: 5 (ref 5–15)
BUN: 10 mg/dL (ref 6–20)
CALCIUM: 9.2 mg/dL (ref 8.9–10.3)
CO2: 23 mmol/L (ref 22–32)
CREATININE: 0.71 mg/dL (ref 0.44–1.00)
Chloride: 109 mmol/L (ref 101–111)
GFR calc non Af Amer: >60 mL/min (ref >60)
GLUCOSE: 161 mg/dL — AB (ref 65–99)
POTASSIUM: 4.1 mmol/L (ref 3.5–5.1)
SODIUM: 137 mmol/L (ref 135–145)

## 2015-03-28 LAB — TSH: TSH: 0.137 u[IU]/mL — AB (ref 0.350–4.500)

## 2015-03-28 LAB — T4, FREE: Free T4: 0.71 ng/dL (ref 0.61–1.12)

## 2015-03-28 MED ORDER — GADOBENATE DIMEGLUMINE 529 MG/ML IV SOLN
20.0000 mL | Freq: Once | INTRAVENOUS | Status: AC | PRN
  Administered 2015-03-28: 18 mL via INTRAVENOUS

## 2015-03-28 NOTE — Progress Notes (Signed)
[facility] [place] NAME: Natasha Luna    MR#:  [id number]  DATE OF BIRTH:  Jun 03, 1985  SUBJECTIVE:  CHIEF COMPLAINT:  Patient is resting comfortably. No other episodes of seizure were noticed. Slept well overnight   Still have some headache.  REVIEW OF SYSTEMS:  CONSTITUTIONAL: No fever, fatigue or weakness.  EYES: No blurred or double vision.  EARS, NOSE, AND THROAT: No tinnitus or ear pain.  RESPIRATORY: No cough, shortness of breath, wheezing or hemoptysis.  CARDIOVASCULAR: No chest pain, orthopnea, edema.  GASTROINTESTINAL: No nausea, vomiting, diarrhea or abdominal pain.  GENITOURINARY: No dysuria, hematuria.  ENDOCRINE: No polyuria, nocturia,  HEMATOLOGY: No anemia, easy bruising or bleeding SKIN: No rash or lesion. MUSCULOSKELETAL: No joint pain or arthritis.   NEUROLOGIC: No tingling, numbness, weakness.  PSYCHIATRY: No anxiety or depression.   DRUG ALLERGIES:  No Known Allergies  VITALS:  Blood pressure 114/56, pulse 65, temperature 98.6 F (37 C), temperature source Oral, resp. rate 20, height 5\' 10"  (1.778 m), weight 86.183 kg (190 lb), last menstrual period 03/12/2015, SpO2 97 %.  PHYSICAL EXAMINATION:  GENERAL:  30 y.o.-year-old patient lying in the bed with no acute distress.  EYES: Pupils equal, round, reactive to light and accommodation. No scleral icterus. Extraocular muscles intact.  HEENT: Head atraumatic, normocephalic. Oropharynx and nasopharynx clear.  NECK:  Supple, no jugular venous distention. No thyroid enlargement, no tenderness.  LUNGS: Normal breath sounds bilaterally, no wheezing, rales,rhonchi or crepitation. No use of accessory muscles of respiration.  CARDIOVASCULAR: S1, S2 normal. No murmurs, rubs, or gallops.  ABDOMEN: Soft, nontender, nondistended. Bowel sounds present. No organomegaly or mass.  EXTREMITIES: No pedal edema, cyanosis, or clubbing.  NEUROLOGIC: Cranial nerves II through XII  are intact. Muscle strength 5/5 in all extremities. Sensation intact. Gait not checked.  PSYCHIATRIC: The patient is alert and oriented x 3.  SKIN: No obvious rash, lesion, or ulcer.    LABORATORY PANEL:   CBC  Recent Labs Lab 03/27/15 0441  WBC 10.1  HGB 13.2  HCT 37.9  PLT 291   ------------------------------------------------------------------------------------------------------------------  Chemistries   Recent Labs Lab 03/26/15 2020  03/28/15 0549  NA 138  < > 137  K 3.3*  < > 4.1  CL 106  < > 109  CO2 22  < > 23  GLUCOSE 106*  < > 161*  BUN 10  < > 10  CREATININE 0.80  < > 0.71  CALCIUM 9.2  < > 9.2  MG 1.8  --   --   < > = values in this interval not displayed. ------------------------------------------------------------------------------------------------------------------  Cardiac Enzymes  Recent Labs Lab 03/26/15 2020  TROPONINI <0.03   ------------------------------------------------------------------------------------------------------------------  RADIOLOGY:  Ct Chest W Contrast  03/27/2015  CLINICAL DATA:  Probable left frontal lobe mass with associated vasogenic edema on a noncontrast head CT obtained yesterday. Clinical concern that this could represent a metastasis. EXAM: CT CHEST, ABDOMEN, AND PELVIS WITH CONTRAST TECHNIQUE: Multidetector CT imaging of the chest, abdomen and pelvis was performed following the standard protocol during bolus administration of intravenous contrast. CONTRAST:  161mL OMNIPAQUE IOHEXOL 300 MG/ML  SOLN COMPARISON:  Head CT obtained yesterday. FINDINGS: CT CHEST FINDINGS Mediastinum/Lymph Nodes: No masses, pathologically enlarged lymph nodes, or other significant abnormality. Lungs/Pleura: No pulmonary mass, infiltrate, or effusion. Musculoskeletal: No chest wall mass or suspicious bone lesions identified. CT ABDOMEN PELVIS FINDINGS Hepatobiliary: No masses or other significant abnormality. Pancreas: No mass, inflammatory  changes, or  other significant abnormality. Spleen: Within normal limits in size and appearance. Adrenals/Urinary Tract: No masses identified. No evidence of hydronephrosis. Stomach/Bowel: No evidence of obstruction, inflammatory process, or abnormal fluid collections. Vascular/Lymphatic: No pathologically enlarged lymph nodes. No evidence of abdominal aortic aneurysm. Reproductive: No mass or other significant abnormality. Intrauterine device in the uterus. Other: Trace amount of free peritoneal fluid in the inferior pelvis, within normal limits of physiological fluid. 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 No evidence of bone metastases. IMPRESSION: No evidence of malignancy in the chest, abdomen or pelvis. Electronically Signed   By: Claudie Revering M.D.   On: 03/27/2015 12:51   Mr Jeri Cos [id number] Contrast  03/28/2015  CLINICAL DATA:  Acute evaluation for seizures. Abnormal head CT with left frontal mass. EXAM: MRI HEAD WITHOUT AND WITH CONTRAST TECHNIQUE: Multiplanar, multiecho pulse sequences of the brain and surrounding structures were obtained without and with intravenous contrast. CONTRAST:  42mL MULTIHANCE GADOBENATE DIMEGLUMINE 529 MG/ML IV SOLN COMPARISON:  Head CT 03/26/2015 FINDINGS: Arising from the left posterior frontal convexity, there is a meningioma measuring 4.7 cm right to left, 3.5 cm front to back and 2.8 cm in thickness. This causes mass effect upon the left cerebral hemisphere and there is extensive vasogenic edema in the left frontal lobe. There is left-to-right shift of 2 mm. No evidence of primary intra-axial brain pathology. No sign of stroke. No hydrocephalus. No extra-axial fluid collection. No pituitary mass. Sinuses, middle ears and mastoids are clear. 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 Mass effect upon the brain with vasogenic edema in the left frontal  lobe. Electronically Signed   By: Nelson Chimes M.D.   On: 03/28/2015 13:20   Ct Abdomen Pelvis W Contrast  03/27/2015  CLINICAL DATA:  Probable left frontal lobe mass with associated vasogenic edema on a noncontrast head CT obtained yesterday. Clinical concern that this could represent a metastasis. EXAM: CT CHEST, ABDOMEN, AND PELVIS WITH CONTRAST TECHNIQUE: Multidetector CT imaging of the chest, abdomen and pelvis was performed following the standard protocol during bolus administration of intravenous contrast. CONTRAST:  169mL OMNIPAQUE IOHEXOL 300 MG/ML  SOLN COMPARISON:  Head CT obtained yesterday. FINDINGS: CT CHEST FINDINGS Mediastinum/Lymph Nodes: No masses, pathologically enlarged lymph nodes, or other significant abnormality. Lungs/Pleura: No pulmonary mass, infiltrate, or effusion. Musculoskeletal: No chest wall mass or suspicious bone lesions identified. CT ABDOMEN PELVIS FINDINGS Hepatobiliary: No masses or other significant abnormality. Pancreas: No mass, inflammatory changes, or other significant abnormality. Spleen: Within normal limits in size and appearance. Adrenals/Urinary Tract: No masses identified. No evidence of hydronephrosis. Stomach/Bowel: No evidence of obstruction, inflammatory process, or abnormal fluid collections. Vascular/Lymphatic: No pathologically enlarged lymph nodes. No evidence of abdominal aortic aneurysm. Reproductive: No mass or other significant abnormality. Intrauterine device in the uterus. Other: Trace amount of free peritoneal fluid in the inferior pelvis, within normal limits of physiological fluid. 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 No evidence of bone metastases. IMPRESSION: No evidence of malignancy in the chest, abdomen or pelvis. Electronically Signed   By: Claudie Revering M.D.   On: 03/27/2015 12:51    EKG:   Orders placed or performed during the hospital encounter of  03/26/15  . EKG 12-Lead  . EKG 12-Lead    ASSESSMENT AND PLAN:   1). 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 -Continue IV Decadron for cerebral edema -CT chest abdomen and pelvis with no masses -Appreciate oncology and neurology recommendations  -Oncology has discussed with radiation oncology - Continue neuro checks  - low TSH- will check- free T3, Free T4   2). 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  - Patient had one seizure about a month ago and was not started on any treatment.  - Neurology is recommending to continue Keppra  500 mg twice a day. - Continue seizure precautions. -Instructed not to drive ,  not to operate heavy machinery and not to perform activities at [facility] and water activities - When necessary IV lorazepam for acute seizures  3). Hypokalemia - Patient presented with a potassium of 3.3. -Repleted. Repeat potassium 3.9 and magnesium 1.8   All the records are reviewed and case discussed with ED provider. Management plans discussed with the patient, family and they are in agreement.   All the records are reviewed and case discussed with Care Management/Social Workerr. Management plans discussed with the patient, family and they are in agreement.  CODE STATUS: FC   TOTAL TIME TAKING CARE OF THIS PATIENT: 35 minutes.   POSSIBLE D/C IN 1-2  DAYS, DEPENDING ON CLINICAL CONDITION.   Vaughan Basta M.D on 03/28/2015 at 9:29 PM  Between 7am to 6pm - Pager - [phone] After 6pm go to [email or web address] - password [email or web address] [facility]  [facility]  Office  [phone]  CC: Primary care physician; [place]

## 2015-03-28 NOTE — Progress Notes (Signed)
[place]  [phone] [phone]  ID: Nickola Major OB: October 06, 1985  MR#: [id number]  [id number]  Patient Care Team: [place] as PCP - General (General Practice)  CHIEF COMPLAINT:  Chief Complaint  Patient presents with  . Seizures    INTERVAL HISTORY: Patient continues to be highly anxious, but otherwise feels well. She has no further seizure activity. She denies any neurologic complaints. Patient offers no further specific complaints.  REVIEW OF SYSTEMS:   Review of Systems  Constitutional: Negative for fever, weight loss and malaise/fatigue.  Respiratory: Negative.   Cardiovascular: Negative.   Gastrointestinal: Negative.   Genitourinary: Negative.   Musculoskeletal: Negative.   Neurological: Positive for seizures. Negative for focal weakness and weakness.    As per HPI. Otherwise, a complete review of systems is negatve.  PAST MEDICAL HISTORY: Past Medical History  Diagnosis Date  . Seizures ([place])     PAST SURGICAL HISTORY: Past Surgical History  Procedure Laterality Date  . Hernia repair      FAMILY HISTORY Family History  Problem Relation Age of Onset  . Diabetes Mother        ADVANCED DIRECTIVES:    HEALTH MAINTENANCE: Social History  Substance Use Topics  . Smoking status: Never Smoker   . Smokeless tobacco: None  . Alcohol Use: None     Colonoscopy:  PAP:  Bone density:  Lipid panel:  No Known Allergies  Current Facility-Administered Medications  Medication Dose Route Frequency Provider Last Rate Last Dose  . dexamethasone (DECADRON) injection 10 mg  10 mg Intravenous 4 times per day Sylvan Cheese, MD   10 mg at 03/28/15 1810  . diphenhydrAMINE (BENADRYL) capsule 50 mg  50 mg Oral Q8H PRN Idelle Crouch, MD   50 mg at 03/27/15 2136  . enoxaparin (LOVENOX) injection 40 mg  40 mg Subcutaneous Q24H Sylvan Cheese, MD   40 mg at 03/28/15 0519  . levETIRAcetam  (KEPPRA) tablet 500 mg  500 mg Oral BID Sylvan Cheese, MD   500 mg at 03/28/15 2102  . LORazepam (ATIVAN) injection 2 mg  2 mg Intravenous Q15 min PRN Sylvan Cheese, MD   2 mg at 03/27/15 1506  . morphine 2 MG/ML injection 2 mg  2 mg Intravenous Q4H PRN Sylvan Cheese, MD   2 mg at 03/27/15 0122    OBJECTIVE: Filed Vitals:   03/28/15 1728 03/28/15 2021  BP: 113/61 114/56  Pulse: 78 65  Temp: 98.2 F (36.8 C) 98.6 F (37 C)  Resp: 18 20     Body mass index is 27.26 kg/(m^2).    ECOG FS:0 - Asymptomatic  General: Well-developed, well-nourished, no acute distress. Eyes: Pink conjunctiva, anicteric sclera. Lungs: Clear to auscultation bilaterally. Heart: Regular rate and rhythm. No rubs, murmurs, or gallops. Abdomen: Soft, nontender, nondistended. No organomegaly noted, normoactive bowel sounds. Musculoskeletal: No edema, cyanosis, or clubbing. Neuro: Alert, answering all questions appropriately. Cranial nerves grossly intact. Skin: No rashes or petechiae noted. Psych: Normal affect.  LAB RESULTS:  Lab Results  Component Value Date   NA 137 03/28/2015   K 4.1 03/28/2015   CL 109 03/28/2015   CO2 23 03/28/2015   GLUCOSE 161* 03/28/2015   BUN 10 03/28/2015   CREATININE 0.71 03/28/2015   CALCIUM 9.2 03/28/2015   GFRNONAA >60 03/28/2015   GFRAA >60 03/28/2015    Lab Results  Component Value Date   WBC 10.1 03/27/2015   NEUTROABS 6.7* 03/26/2015   HGB  13.2 03/27/2015   HCT 37.9 03/27/2015   MCV 91.4 03/27/2015   PLT 291 03/27/2015     STUDIES: Ct Head Wo Contrast  03/26/2015  CLINICAL DATA:  Witnessed seizure today at [employer], one previous seizure last month, pt seen at [facility], but states no imaging was done EXAM: CT HEAD WITHOUT CONTRAST TECHNIQUE: Contiguous axial images were obtained from the base of the skull through the vertex without intravenous contrast. COMPARISON:  None. FINDINGS: There is significant vasogenic edema in the left frontal lobe,  suspicious for focal mass. There is focal left-to-right midline shift measuring 6 mm. No associated hemorrhage. There is mild mass effect on the left frontal warm. Otherwise, the basilar cisterns and ventricles are normal in appearance. Bone windows are unremarkable. IMPRESSION: 1. Left frontal lobe vasogenic edema with associated mass. 2. Findings are suspicious for left frontal lobe mass. Further evaluation with MRI is recommended. 3. Critical Value/emergent results were called by telephone at the time of interpretation on 03/26/2015 at 9:47 p.m. to Dr. Larae Grooms , who verbally acknowledged these results. Electronically Signed   By: Nolon Nations M.D.   On: 03/26/2015 21:50   Ct Chest W Contrast  03/27/2015  CLINICAL DATA:  Probable left frontal lobe mass with associated vasogenic edema on a noncontrast head CT obtained yesterday. Clinical concern that this could represent a metastasis. EXAM: CT CHEST, ABDOMEN, AND PELVIS WITH CONTRAST TECHNIQUE: Multidetector CT imaging of the chest, abdomen and pelvis was performed following the standard protocol during bolus administration of intravenous contrast. CONTRAST:  17mL OMNIPAQUE IOHEXOL 300 MG/ML  SOLN COMPARISON:  Head CT obtained yesterday. FINDINGS: CT CHEST FINDINGS Mediastinum/Lymph Nodes: No masses, pathologically enlarged lymph nodes, or other significant abnormality. Lungs/Pleura: No pulmonary mass, infiltrate, or effusion. Musculoskeletal: No chest wall mass or suspicious bone lesions identified. CT ABDOMEN PELVIS FINDINGS Hepatobiliary: No masses or other significant abnormality. Pancreas: No mass, inflammatory changes, or other significant abnormality. Spleen: Within normal limits in size and appearance. Adrenals/Urinary Tract: No masses identified. No evidence of hydronephrosis. Stomach/Bowel: No evidence of obstruction, inflammatory process, or abnormal fluid collections. Vascular/Lymphatic: No pathologically enlarged lymph nodes. No evidence  of abdominal aortic aneurysm. Reproductive: No mass or other significant abnormality. Intrauterine device in the uterus. Other: Trace amount of free peritoneal fluid in the inferior pelvis, within normal limits of physiological fluid. 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 No evidence of bone metastases. IMPRESSION: No evidence of malignancy in the chest, abdomen or pelvis. Electronically Signed   By: Claudie Revering M.D.   On: 03/27/2015 12:51   Mr Jeri Cos [id number] Contrast  03/28/2015  CLINICAL DATA:  Acute evaluation for seizures. Abnormal head CT with left frontal mass. EXAM: MRI HEAD WITHOUT AND WITH CONTRAST TECHNIQUE: Multiplanar, multiecho pulse sequences of the brain and surrounding structures were obtained without and with intravenous contrast. CONTRAST:  42mL MULTIHANCE GADOBENATE DIMEGLUMINE 529 MG/ML IV SOLN COMPARISON:  Head CT 03/26/2015 FINDINGS: Arising from the left posterior frontal convexity, there is a meningioma measuring 4.7 cm right to left, 3.5 cm front to back and 2.8 cm in thickness. This causes mass effect upon the left cerebral hemisphere and there is extensive vasogenic edema in the left frontal lobe. There is left-to-right shift of 2 mm. No evidence of primary intra-axial brain pathology. No sign of stroke. No hydrocephalus. No extra-axial fluid collection. No pituitary mass. Sinuses, middle ears and mastoids are clear. IMPRESSION: Left posterior frontal convexity  meningioma measuring 4.7 x 3.5 x 2.8 cm. Mass effect upon the brain with vasogenic edema in the left frontal lobe. Electronically Signed   By: Nelson Chimes M.D.   On: 03/28/2015 13:20   Ct Abdomen Pelvis W Contrast  03/27/2015  CLINICAL DATA:  Probable left frontal lobe mass with associated vasogenic edema on a noncontrast head CT obtained yesterday. Clinical concern that this could represent a metastasis. EXAM: CT CHEST, ABDOMEN, AND PELVIS WITH  CONTRAST TECHNIQUE: Multidetector CT imaging of the chest, abdomen and pelvis was performed following the standard protocol during bolus administration of intravenous contrast. CONTRAST:  135mL OMNIPAQUE IOHEXOL 300 MG/ML  SOLN COMPARISON:  Head CT obtained yesterday. FINDINGS: CT CHEST FINDINGS Mediastinum/Lymph Nodes: No masses, pathologically enlarged lymph nodes, or other significant abnormality. Lungs/Pleura: No pulmonary mass, infiltrate, or effusion. Musculoskeletal: No chest wall mass or suspicious bone lesions identified. CT ABDOMEN PELVIS FINDINGS Hepatobiliary: No masses or other significant abnormality. Pancreas: No mass, inflammatory changes, or other significant abnormality. Spleen: Within normal limits in size and appearance. Adrenals/Urinary Tract: No masses identified. No evidence of hydronephrosis. Stomach/Bowel: No evidence of obstruction, inflammatory process, or abnormal fluid collections. Vascular/Lymphatic: No pathologically enlarged lymph nodes. No evidence of abdominal aortic aneurysm. Reproductive: No mass or other significant abnormality. Intrauterine device in the uterus. Other: Trace amount of free peritoneal fluid in the inferior pelvis, within normal limits of physiological fluid. 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 No evidence of bone metastases. IMPRESSION: No evidence of malignancy in the chest, abdomen or pelvis. Electronically Signed   By: Claudie Revering M.D.   On: 03/27/2015 12:51    ASSESSMENT: 4.7 cm meningioma in left frontal lobe.  PLAN:    1. Left meningioma: MRI brain results reviewed independently and reported as above. CT scan of the chest, abdomen, pelvis did not reveal any significant pathology. Patient will require neurosurgical consultation likely as an outpatient. Case discussed with radiation oncology. No acute indication for XRT at this time. Continue Decadron and Keppra as  ordered. Okay to discharge from an oncology standpoint. We will attempt to discuss case with neurosurgery in the morning.  2. Seizures: Secondary to left frontal lobe mass. Appreciate neurology input. Decadron as above to reduce vasogenic edema. Continue Keppra as ordered. 3. Disposition: Patient can likely be discharged in several days on oral Decadron and Keppra.   Lloyd Huger, MD   03/28/2015 10:38 PM

## 2015-03-29 ENCOUNTER — Telehealth: Payer: Self-pay | Admitting: *Deleted

## 2015-03-29 MED ORDER — LEVETIRACETAM 500 MG PO TABS: 500.0000 mg | ORAL_TABLET | Freq: Two times a day (BID) | ORAL | Status: DC

## 2015-03-29 MED ORDER — DEXAMETHASONE 4 MG PO TABS: 8.0000 mg | ORAL_TABLET | Freq: Three times a day (TID) | ORAL | Status: DC

## 2015-03-29 MED ORDER — DIPHENHYDRAMINE HCL 25 MG PO CAPS: 25.0000 mg | ORAL_CAPSULE | Freq: Three times a day (TID) | ORAL | Status: DC | PRN

## 2015-03-29 NOTE — Telephone Encounter (Signed)
Able to get in touch with [facility] but had to leave message for Dr. Brett Albino with pt's updated phone number. Informed Dr. Brett Albino to call us back if has any further questions.

## 2015-03-29 NOTE — Progress Notes (Signed)
Patient informed of transfer. Report given to [facility]. Patient in no signs or symptoms of distress. Vitals signs stable. Will continue to monitor,

## 2015-03-29 NOTE — Telephone Encounter (Signed)
Tracie called to inform our office that phone number on chart has been updated and needs to be given to [facility] in order to reach patient to schedule appt.

## 2015-03-29 NOTE — Discharge Instructions (Addendum)
Continue Decadron and keppra Until you see Cancer center or neuro surgery in office. Dr. Gary Fleet office to call her with appointment details for neuro surgery.  [facility] s[facility] will call pt on the home number in 1-2 days.

## 2015-03-29 NOTE — Telephone Encounter (Signed)
Attempted to call [facility] to update their records. No answer and there was no voicemail. Will try again this afternoon.

## 2015-03-29 NOTE — Progress Notes (Signed)
Pt and family given d/c instructions r/t activity, diet, followup care and medications, prescriptions x 3 given to pt, voiced understanding, pt d/c home via wheelchair escorted by family and staff

## 2015-03-29 NOTE — Progress Notes (Signed)
Case discussed with [facility] as well as Dr. Anselm Jungling. Okay to discharge on oral Decadron 8 mg every 8 hours. Please instruct patient to stay on this dose until she is evaluated either by medical oncology or surgical oncology. Continue Keppra as prescribed by neurology. Case also discussed with radiation oncology, and this consult can be canceled until patient has evaluation and biopsy with neurosurgery. Patient should follow-up in the [place] in 1-2 weeks after her neurosurgery appointment for further evaluation.  Appreciate consult.

## 2015-03-30 LAB — T3, FREE: T3, Free: 2.1 pg/mL (ref 2.0–4.4)

## 2015-04-04 NOTE — Discharge Summary (Signed)
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 Seizures ([place])     HOSPITAL COURSE:   1). Left frontal lobe mass causing cerebral edema with mass effect adjacent area - Patient presents to the ED with a seizure but was found to have left frontal lobe mass causing a mass effect on CT scan. 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 -Continue IV Decadron for cerebral edema -CT chest abdomen and pelvis with no masses -Appreciate oncology and neurology recommendations  -Oncology has discussed with radiation oncology - Continue neuro checks  - low TSH-  check- free T3, Free T4  - oncology set up her appointment with [facility] neuro surgery for further management.  2). 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  - Patient had one seizure about a month ago and was not started on any treatment.  - Neurology is recommending to continue Keppra 500 mg twice a day. - Continue seizure precautions. -Instructed not to drive , not to operate heavy machinery and not to perform activities at [facility] and water activities - When necessary IV lorazepam for acute seizures  3). Hypokalemia - Patient presented with a potassium of 3.3. -Repleted. Repeat potassium 3.9 and magnesium 1.8   DISCHARGE CONDITIONS:    Stable.  CONSULTS OBTAINED:  Treatment Team:  Alexis Goodell, MD Lloyd Huger, MD  DRUG ALLERGIES:  No Known Allergies  DISCHARGE MEDICATIONS:   Discharge Medication List as of 03/29/2015  1:46 PM    START taking these medications   Details  dexamethasone (DECADRON) 4 MG tablet Take 2 tablets (8 mg total) by mouth 3 (three) times daily., Starting 03/29/2015, Until Discontinued, Print    diphenhydrAMINE (BENADRYL) 25 mg capsule Take 1 capsule (25 mg total) by mouth every 8 (eight) hours as needed for itching or sleep., Starting 03/29/2015, Until Discontinued, Print    levETIRAcetam (KEPPRA) 500 MG tablet Take 1 tablet (500 mg total) by mouth 2 (two) times daily., Starting 03/29/2015, Until Discontinued, Print         DISCHARGE INSTRUCTIONS:    followwith [facility] neurosurgery, as recommended.  If you experience worsening of your admission symptoms, develop shortness of breath, life threatening emergency, suicidal or homicidal thoughts you must seek medical attention immediately by calling 911 or calling your MD immediately  if symptoms less severe.  You Must read complete instructions/literature along with all the possible adverse reactions/side effects for all the Medicines you take and that have been prescribed to you. Take any new Medicines after you have completely understood and accept all the possible adverse reactions/side effects.   Please note  You were cared for by a hospitalist during your hospital stay. If you have any questions about your discharge medications or the care you received while you were in the hospital after you are discharged, you can  call the unit and asked to speak with the hospitalist on call if the hospitalist that took care of you is not available. Once you are discharged, your primary care physician will handle any further medical issues. Please note that NO REFILLS for any discharge medications will be authorized once you are discharged, as it  is imperative that you return to your primary care physician (or establish a relationship with a primary care physician if you do not have one) for your aftercare needs so that they can reassess your need for medications and monitor your lab values.    Today   CHIEF COMPLAINT:   Chief Complaint  Patient presents with  . Seizures    HISTORY OF PRESENT ILLNESS:  Natasha Luna  is a 30 y.o. female with no significant medical history except for a seizure that occurred about one month ago. She presented to the ED today after she had another episode of seizures. Patient was said to [place] around [place] her [place] when she suddenly spun to the left and started falling. While she was falling she was reported to have arm and leg contraction and her fall incomplete as her friend stop her from completely falling to the ground. She continues to have spasms for about 1 minute and she had some jerking movements all over her body with twisting of late and have this was said to have become ashen at that time. There is no frothy saliva or biting of tongue observed and the school episode of left-sided about 1 minute. After the seizure episode, patient was confused for a few minutes but there was no reported paralysis or incontinence. She was reported to have some fast heart beat and fast breathing after this episode. Patient denies any alcohol use or recreational drug use. She had one seizure about a month ago while bathing her baby but she was not started on treatment because he was a first episode.  On arrival in the ED today, vital signs are stable except for an initial respiratory rate of 25 but this has resolved at this time. CBC was unremarkable and CMP was significant for potassium of 3.3 only. Troponin was negative. CT of the head showed left frontal lobe mass with vasogenic edema as well as some 6 mm left-to-right shift. There is no hemorrhage in the brain. EKG shows sinus tachycardia at  106 with left atrial enlargement and left axis deviation and decreased voltage. 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   VITAL SIGNS:  Blood pressure 104/56, pulse 68, temperature 98.6 F (37 C), temperature source Oral, resp. rate 18, height 5\' 10"  (1.778 m), weight 86.183 kg (190 lb), last menstrual period 03/12/2015, SpO2 97 %.  I/O:  No intake or output data in the 24 hours ending 04/04/15 0749  PHYSICAL EXAMINATION:  GENERAL:  30 y.o.-year-old patient lying in the bed with no acute distress.  EYES: Pupils equal, round, reactive to light and accommodation. No scleral icterus. Extraocular muscles intact.  HEENT: Head atraumatic, normocephalic. Oropharynx and nasopharynx clear.  NECK:  Supple, no jugular venous distention. No thyroid enlargement, no tenderness.  LUNGS: Normal breath sounds bilaterally, no wheezing, rales,rhonchi or crepitation. No use of accessory muscles of respiration.  CARDIOVASCULAR: S1, S2 normal. No murmurs, rubs, or gallops.  ABDOMEN: Soft,  non-tender, non-distended. Bowel sounds present. No organomegaly or mass.  EXTREMITIES: No pedal edema, cyanosis, or clubbing.  NEUROLOGIC: Cranial nerves II through XII are intact. Muscle strength 5/5 in all extremities. Sensation intact. Gait not checked.  PSYCHIATRIC: The patient is alert and oriented x 3.  SKIN: No obvious rash, lesion, or ulcer.   DATA REVIEW:   CBC No results for input(s): WBC, HGB, HCT, PLT in the last 168 hours.  Chemistries  No results for input(s): NA, K, CL, CO2, GLUCOSE, BUN, CREATININE, CALCIUM, MG, AST, ALT, ALKPHOS, BILITOT in the last 168 hours.  Invalid input(s): GFRCGP  Cardiac Enzymes No results for input(s): TROPONINI in the last 168 hours.  Microbiology Results  No results found for this or any previous  visit.  RADIOLOGY:  No results found.  EKG:   Orders placed or performed during the hospital encounter of 03/26/15  . EKG 12-Lead  . EKG 12-Lead      Management plans discussed with the patient, family and they are in agreement.  CODE STATUS:  Code Status History    Date Active Date Inactive Code Status Order ID Comments User Context   03/27/2015  3:14 AM 03/29/2015  8:47 PM Full Code [id number]  Sylvan Cheese, MD Inpatient      TOTAL TIME TAKING CARE OF THIS PATIENT: 35 minutes.    Vaughan Basta M.D on 04/04/2015 at 7:49 AM  Between 7am to 6pm - Pager - [phone]  After 6pm go to [email or web address] - password [email or web address] [facility]  Office  [phone]  CC: Primary care physician; [facility]   Note: This dictation was prepared with Dragon dictation along with smaller phrase technology. Any transcriptional errors that result from this process are unintentional.

## 2015-04-06 ENCOUNTER — Telehealth: Payer: Self-pay

## 2015-04-06 NOTE — Telephone Encounter (Signed)
-----   Message from Cephus Richer sent at 04/06/2015 10:10 AM EDT ----- Contact: [phone] Per pt she think someone referred her to [facility] neurosurgeon but not sure who. Pt states that [facility] was very rude and want to a referral to [facility]. Do anyone know anything about this?

## 2015-04-06 NOTE — Telephone Encounter (Signed)
Patient was evaluated in ER then referred to [facility].  New referral sent to [facility]

## 2015-04-11 NOTE — Telephone Encounter (Signed)
Appt at [facility] on 04/11/2015 @ 10:00

## 2016-09-17 ENCOUNTER — Ambulatory Visit: Payer: Self-pay | Attending: Oncology | Admitting: *Deleted

## 2016-09-17 ENCOUNTER — Encounter (INDEPENDENT_AMBULATORY_CARE_PROVIDER_SITE_OTHER): Payer: Self-pay

## 2016-09-17 ENCOUNTER — Encounter: Payer: Self-pay | Admitting: *Deleted

## 2016-09-17 VITALS — BP 105/70 | HR 65 | Ht 69.0 in | Wt 211.0 lb

## 2016-09-17 DIAGNOSIS — R87619 Unspecified abnormal cytological findings in specimens from cervix uteri: Secondary | ICD-10-CM

## 2016-09-17 NOTE — Patient Instructions (Signed)
"  understanding Pap Results" given to the patient.

## 2016-09-17 NOTE — Progress Notes (Signed)
Subjective:     Patient ID: Natasha Luna, female   DOB: Apr 02, 1985, 31 y.o.   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  Patient has a personal history of a benign brain tumor in 2017.  State she also had an abnormal pap in 2017.  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  Reviewed pap results and need for colposcopy and possible biopsy.  Patient has been screened for eligibility.  She does not have any insurance, Medicare or Medicaid.  She also meets financial eligibility.  Hand-out given on the Affordable Care Act.    Plan:     Jeanella Anton to schedule patient to see one of the GYN's at [facility] or [facility] and call her with the appointment since they are closed now for lunch.  Will follow-up per [facility] protocol.

## 2016-10-04 ENCOUNTER — Ambulatory Visit: Payer: Self-pay | Admitting: Obstetrics and Gynecology

## 2016-10-10 ENCOUNTER — Encounter: Payer: Self-pay | Admitting: Advanced Practice Midwife

## 2016-10-16 ENCOUNTER — Ambulatory Visit: Payer: Self-pay | Admitting: Obstetrics and Gynecology

## 2016-11-14 ENCOUNTER — Ambulatory Visit: Payer: Self-pay | Admitting: Obstetrics and Gynecology

## 2016-11-23 ENCOUNTER — Telehealth: Payer: Self-pay | Admitting: *Deleted

## 2016-11-23 NOTE — Telephone Encounter (Signed)
Called patient and discussed the importance of follow-up of her abnormal pap smear.  She has cancelled or no-showed for 3 appointment that had been scheduled at [facility].  States she wants her mom to go with her, and she will reschedule next week when her mom is back in town.

## 2016-12-13 ENCOUNTER — Ambulatory Visit: Payer: Self-pay | Admitting: Obstetrics and Gynecology

## 2017-01-02 ENCOUNTER — Ambulatory Visit: Payer: Self-pay | Admitting: Obstetrics and Gynecology

## 2017-01-09 ENCOUNTER — Ambulatory Visit: Payer: Self-pay | Admitting: Obstetrics and Gynecology

## 2017-01-15 ENCOUNTER — Ambulatory Visit (INDEPENDENT_AMBULATORY_CARE_PROVIDER_SITE_OTHER): Payer: Self-pay | Admitting: Obstetrics and Gynecology

## 2017-01-15 ENCOUNTER — Encounter: Payer: Self-pay | Admitting: Obstetrics and Gynecology

## 2017-01-15 VITALS — BP 104/68 | HR 77 | Ht 70.0 in | Wt 211.0 lb

## 2017-01-15 DIAGNOSIS — R87810 Cervical high risk human papillomavirus (HPV) DNA test positive: Secondary | ICD-10-CM

## 2017-01-15 DIAGNOSIS — R87619 Unspecified abnormal cytological findings in specimens from cervix uteri: Secondary | ICD-10-CM

## 2017-01-15 DIAGNOSIS — R87610 Atypical squamous cells of undetermined significance on cytologic smear of cervix (ASC-US): Secondary | ICD-10-CM

## 2017-01-15 NOTE — Progress Notes (Signed)
   GYNECOLOGY CLINIC COLPOSCOPY PROCEDURE NOTE  32 y.o. 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 Discussed underlying role for HPV infection in the development of cervical dysplasia, its natural history and progression/regression, need for surveillance.  Is the patient  pregnant: No LMP: Patient's last menstrual period was 01/11/2017. Smoking status:   Contraception: None Number current sexual partners:  0 Future fertility desired:  Yes  Endometrial Biopsy After discussion with the patient regarding her abnormal uterine bleeding I recommended that she proceed with an endometrial biopsy for further diagnosis. The risks, benefits, alternatives, and indications for an endometrial biopsy were discussed with the patient in detail. She understood the risks including infection, bleeding, cervical laceration and uterine perforation.  Verbal consent was obtained.   PROCEDURE NOTE:  P[place]elle endometrial biopsy was performed using aseptic technique with iodine preparation.  The uterus was sounded to a length of 9 cm.  Adequate sampling was obtained with minimal blood loss.  The patient tolerated the procedure well.  Disposition will be pending pathology.  Patient given informed consent, signed copy in the chart, time out was performed.  The patient was position in dorsal lithotomy position. Speculum was placed the cervix was visualized.   After application of acetic acid colposcopic inspection of the cervix was undertaken.   Colposcopy adequate, full visualization of transformation zone: Yes acetowhite lesion(s) noted at 1 and 5 o'clock; corresponding biopsies obtained.   ECC specimen obtained:  Yes  All specimens were labeled and sent to pathology.   Patient was given post procedure instructions.  Will follow up pathology and manage accordingly.  Routine preventative health maintenance measures emphasized.  Physical Exam    Constitutional: She appears well-developed.  Genitourinary: Vagina normal. Cervix does not exhibit discharge.    HENT:  Head: Normocephalic and atraumatic.  Cardiovascular: Normal rate.  Pulmonary/Chest: Effort normal.  Abdominal: Soft.  Skin: Skin is warm and dry.  Psychiatric: She has a normal mood and affect. Her behavior is normal. Judgment and thought content normal.  Nursing note and vitals reviewed.   Adrian Prows, MD [facility] 01/15/17 1:14 PM

## 2017-01-17 LAB — PATHOLOGY

## 2017-01-18 ENCOUNTER — Encounter: Payer: Self-pay | Admitting: *Deleted

## 2017-01-18 NOTE — Progress Notes (Signed)
Talked with Natasha Luna at [facility] today.  Requested that once the patient has been told of her biopsy results with a [id number], please have the patient call Webb Silversmith with [facility] to complete B[facility] Medicaid paperwork.  She is agreeable.

## 2017-01-24 NOTE — Progress Notes (Signed)
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

## 2017-01-24 NOTE — Progress Notes (Signed)
Called and discussed result with patient. LEEP needed. Given number for [facility] to call. Discussed risks of procedure with patient such as short cervix, cervical insufficiency and scar tissue causing difficulty with dilation.

## 2017-01-24 NOTE — Progress Notes (Signed)
  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  Phone patient and determined she is not eligible for [facility] because she is not a [place] citizen/legal permanent resident.  Patient mentioned possibility of going to [facility] or applying for financial assistance at [facility].  Spoke to [employer] at [place] to confirm ineligibilty.  Left message for patient, and [facility] to call back to discuss follow-up options.

## 2017-01-28 ENCOUNTER — Telehealth: Payer: Self-pay | Admitting: *Deleted

## 2017-01-28 NOTE — Telephone Encounter (Signed)
  Oncology Nurse Navigator Documentation  Navigator Location: [facility] (01/28/17 1100)   )Navigator Encounter Type: Telephone (01/28/17 1100) Telephone: Outgoing Call (01/28/17 1100)                                                  Time Spent with Patient: 15 (01/28/17 1100)   Left patient a message to return my call.  I would like to assist with making a referral to GYN if she has decided if she wants to come here or be referred to [facility].  Will try and cc copy to Antoine Primas, [place] [facility] and also her primary care provider at [facility].

## 2017-01-29 ENCOUNTER — Telehealth: Payer: Self-pay | Admitting: *Deleted

## 2017-01-29 NOTE — Telephone Encounter (Signed)
Called patient yesterday in regards to scheduling an appointment for follow-up of her FXT024 biopsy results.  States she would like to go back to [facility].  I have talked with Andee Poles and Judeen Hammans with [place] where the patient was seen before by Dr. Felicity Coyer.  Patient informed of her appointment with Dr. Felicity Coyer at [facility] on February 27, 2017 @ 10:00.  Encouraged her to take a photo ID, all meds and to arrive 30 minutes early.  She is agreeable.

## 2017-05-13 IMAGING — CT CT HEAD W/O CM
2 series · 14 of 30 positions shown, 16 images · non-contrast
Comparison: None.

CLINICAL DATA: Witnessed seizure today at [employer] [employer], one
previous seizure last month, pt seen at [facility], but states no
imaging was done

EXAM:
CT HEAD WITHOUT CONTRAST
TECHNIQUE: Contiguous axial images were obtained from the base of the skull
through the vertex without intravenous contrast.

[Series 2: head wo · axial · 0.47mm/px · z∈[-177,-78]mm · 6 of 32 slices shown, 8 images]
[im 5/32  brain]
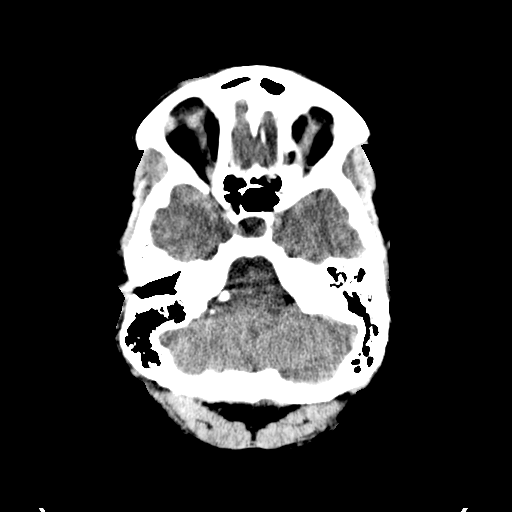
[im 5/32  bone]
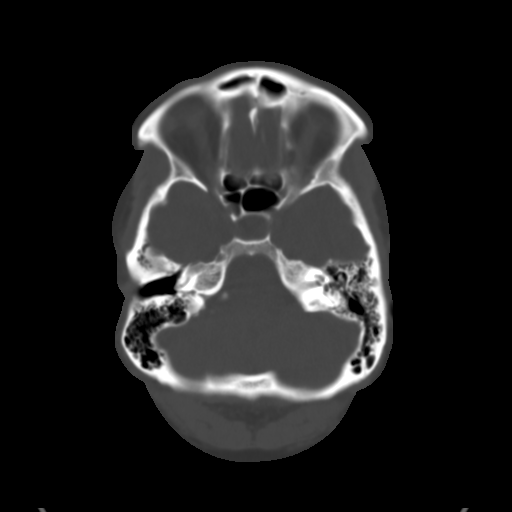
[im 9/32  brain]
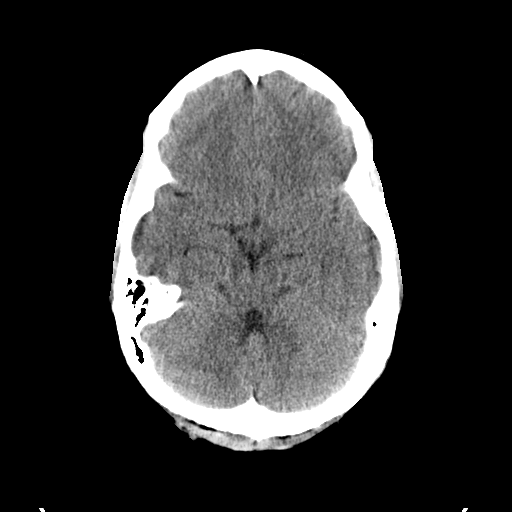
[im 14/32  brain]
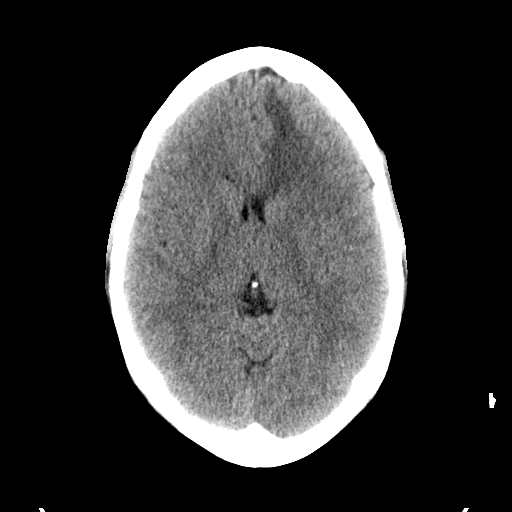
[im 18/32  brain]
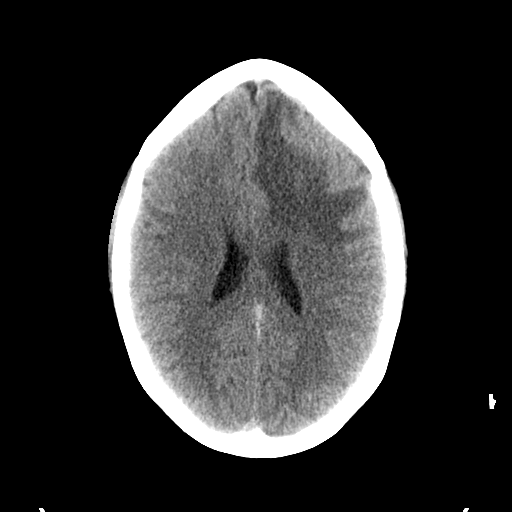
[im 23/32  brain]
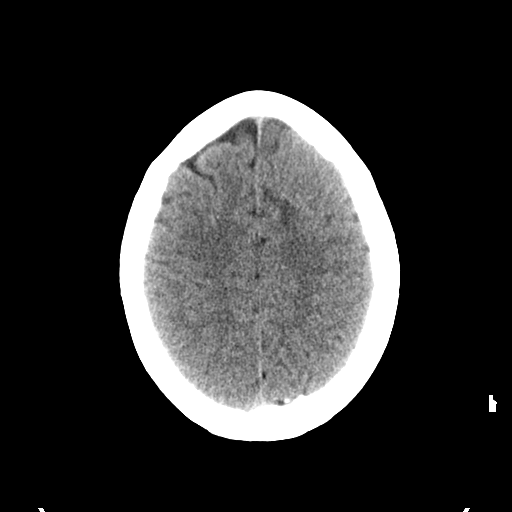
[im 23/32  bone]
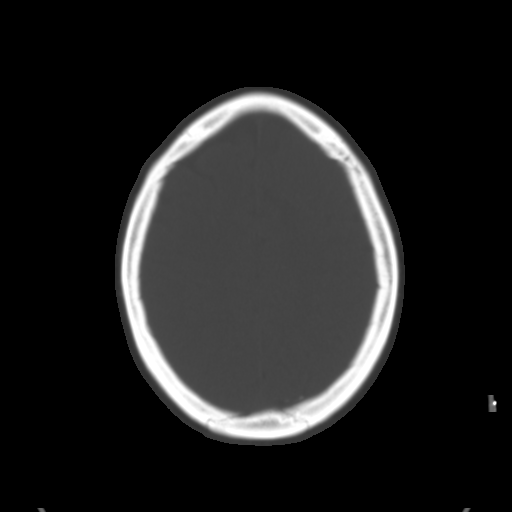
[im 27/32  brain]
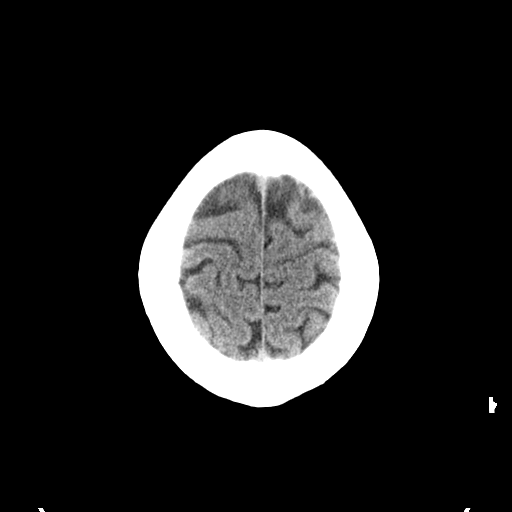

[Series 3: head bone · axial · 0.47mm/px · z∈[-183,-69]mm · 8 of 96 slices shown]
[im 10/96  bone]
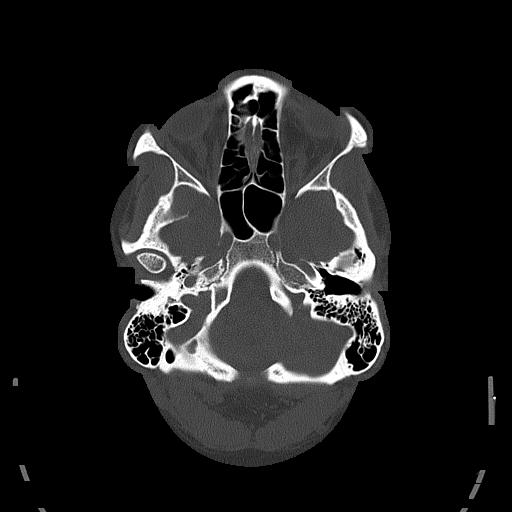
[im 19/96  bone]
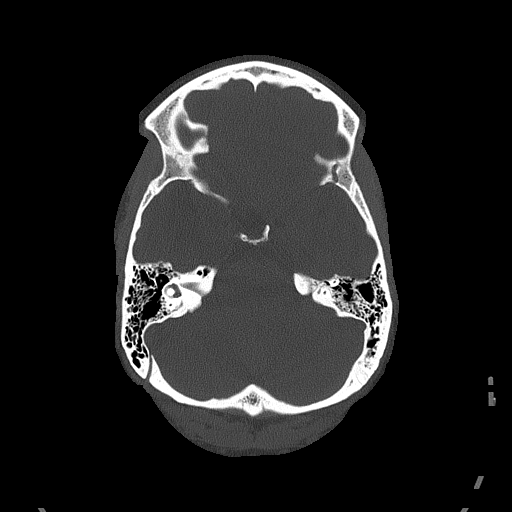
[im 32/96  bone]
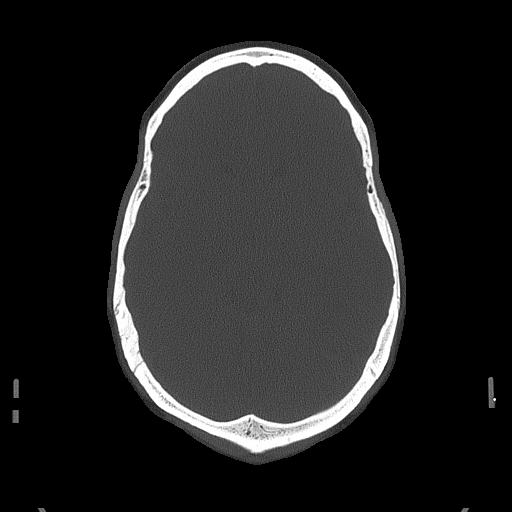
[im 41/96  bone]
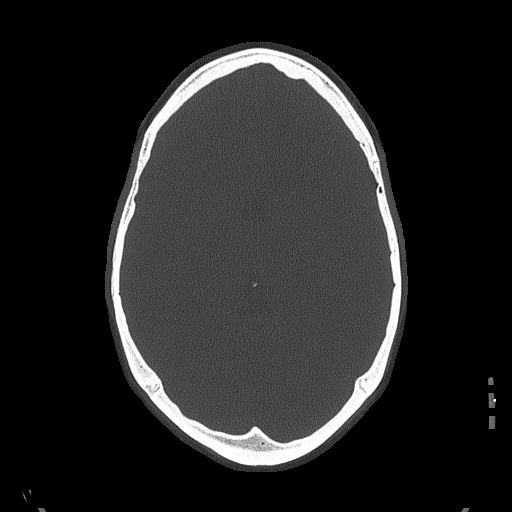
[im 55/96  bone]
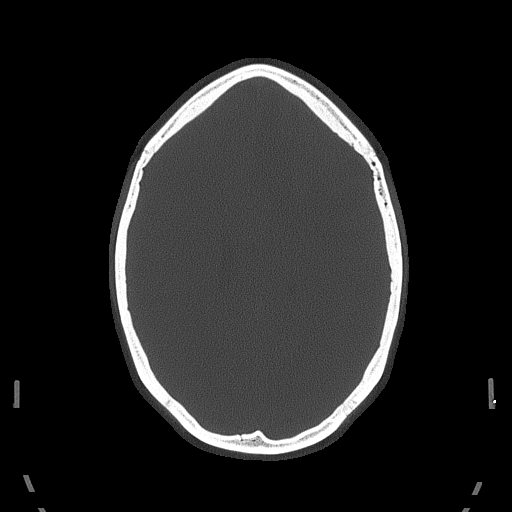
[im 64/96  bone]
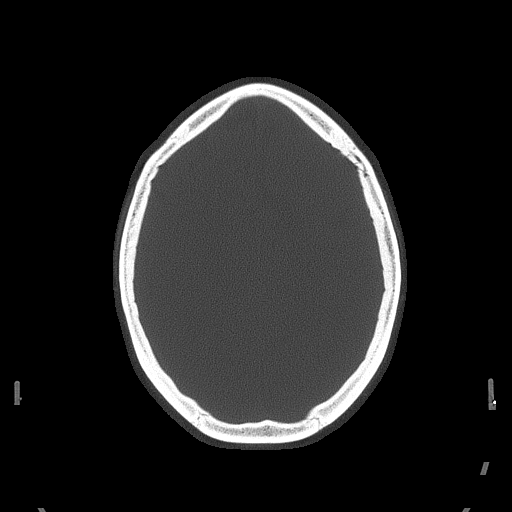
[im 77/96  bone]
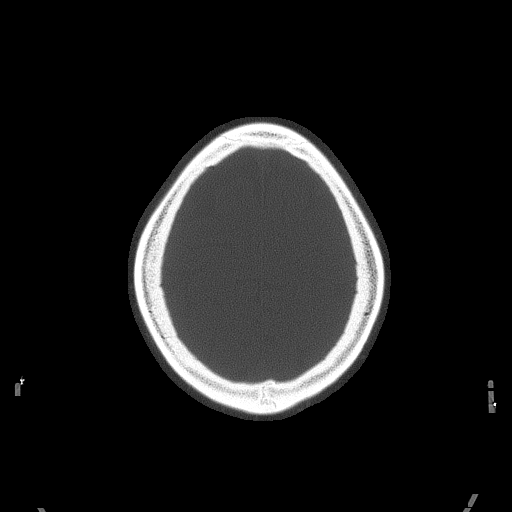
[im 86/96  bone]
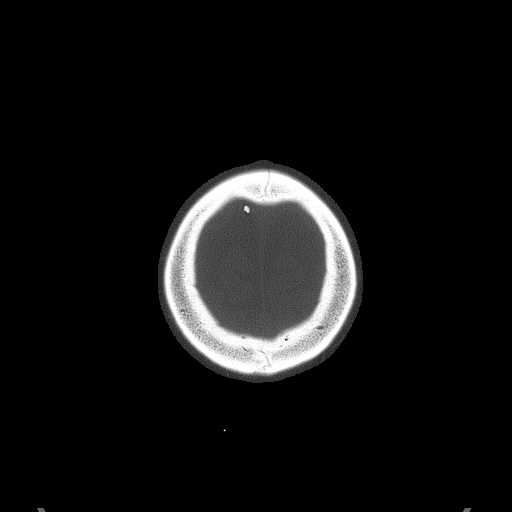

[14 of 30 positions shown; findings below may reference images not displayed]

FINDINGS: There is significant vasogenic edema in the left frontal lobe,
suspicious for focal mass. There is focal left-to-right midline
shift measuring 6 mm. No associated hemorrhage. There is mild mass
effect on the left frontal warm. Otherwise, the basilar cisterns and
ventricles are normal in appearance.

Bone windows are unremarkable.
IMPRESSION: 1. Left frontal lobe vasogenic edema with associated mass.
2. Findings are suspicious for left frontal lobe mass. Further
evaluation with MRI is recommended.
3. Critical Value/emergent results were called by telephone at the
time of interpretation on 03/26/2015 at [DATE] p.m. to Dr. DOS
KOBY , who verbally acknowledged these results.

## 2019-01-28 ENCOUNTER — Ambulatory Visit: Payer: Self-pay | Attending: Internal Medicine

## 2019-01-28 DIAGNOSIS — Z20822 Contact with and (suspected) exposure to covid-19: Secondary | ICD-10-CM | POA: Insufficient documentation

## 2019-01-29 LAB — SPECIMEN STATUS REPORT

## 2019-01-29 LAB — NOVEL CORONAVIRUS, NAA: SARS-CoV-2, NAA: NOT DETECTED

## 2019-01-30 ENCOUNTER — Telehealth: Payer: Self-pay | Admitting: General Practice

## 2019-01-30 NOTE — Telephone Encounter (Signed)
Negative COVID results given. Patient results "NOT Detected." Caller expressed understanding. ° °

## 2019-02-06 ENCOUNTER — Ambulatory Visit: Payer: Self-pay | Attending: Internal Medicine

## 2019-02-06 DIAGNOSIS — Z20822 Contact with and (suspected) exposure to covid-19: Secondary | ICD-10-CM | POA: Insufficient documentation

## 2019-02-07 LAB — NOVEL CORONAVIRUS, NAA: SARS-CoV-2, NAA: NOT DETECTED

## 2019-02-11 ENCOUNTER — Telehealth: Payer: Self-pay | Admitting: General Practice

## 2019-02-11 NOTE — Telephone Encounter (Signed)
Patient informed of negative covid result. Patient verbalized understanding.

## 2021-10-04 ENCOUNTER — Ambulatory Visit: Payer: Self-pay | Admitting: Podiatry
